# Patient Record
Sex: Female | Born: 1950 | Race: White | Hispanic: No | Smoking: Never smoker
Health system: Southern US, Community
[De-identification: ages and names within clinical notes are randomized; demographics above are authoritative.]

## PROBLEM LIST (undated history)

## (undated) DIAGNOSIS — K219 Gastro-esophageal reflux disease without esophagitis: Secondary | ICD-10-CM

## (undated) DIAGNOSIS — S62133A Displaced fracture of capitate [os magnum] bone, unspecified wrist, initial encounter for closed fracture: Secondary | ICD-10-CM

## (undated) DIAGNOSIS — G4730 Sleep apnea, unspecified: Secondary | ICD-10-CM

## (undated) DIAGNOSIS — E119 Type 2 diabetes mellitus without complications: Secondary | ICD-10-CM

## (undated) DIAGNOSIS — C801 Malignant (primary) neoplasm, unspecified: Secondary | ICD-10-CM

## (undated) DIAGNOSIS — I10 Essential (primary) hypertension: Secondary | ICD-10-CM

## (undated) DIAGNOSIS — S82891A Other fracture of right lower leg, initial encounter for closed fracture: Secondary | ICD-10-CM

## (undated) DIAGNOSIS — F4001 Agoraphobia with panic disorder: Secondary | ICD-10-CM

## (undated) DIAGNOSIS — F332 Major depressive disorder, recurrent severe without psychotic features: Secondary | ICD-10-CM

## (undated) DIAGNOSIS — M1990 Unspecified osteoarthritis, unspecified site: Secondary | ICD-10-CM

## (undated) DIAGNOSIS — D472 Monoclonal gammopathy: Secondary | ICD-10-CM

## (undated) HISTORY — PX: JOINT REPLACEMENT: SHX530

## (undated) HISTORY — DX: Essential (primary) hypertension: I10

## (undated) HISTORY — PX: KNEE ARTHROSCOPY: SUR90

## (undated) HISTORY — DX: Agoraphobia with panic disorder: F40.01

## (undated) HISTORY — DX: Sleep apnea, unspecified: G47.30

## (undated) HISTORY — DX: Major depressive disorder, recurrent severe without psychotic features: F33.2

## (undated) HISTORY — PX: TUBAL LIGATION: SHX77

## (undated) HISTORY — DX: Type 2 diabetes mellitus without complications: E11.9

## (undated) HISTORY — DX: Monoclonal gammopathy: D47.2

## (undated) HISTORY — DX: Unspecified osteoarthritis, unspecified site: M19.90

## (undated) HISTORY — DX: Gastro-esophageal reflux disease without esophagitis: K21.9

## (undated) HISTORY — PX: CHOLECYSTECTOMY: SHX55

---

## 2010-11-24 ENCOUNTER — Ambulatory Visit: Payer: Self-pay | Admitting: Family Medicine

## 2012-03-21 ENCOUNTER — Ambulatory Visit: Payer: Self-pay | Admitting: Family Medicine

## 2012-06-25 ENCOUNTER — Ambulatory Visit: Payer: Self-pay | Admitting: Family Medicine

## 2012-07-16 ENCOUNTER — Ambulatory Visit: Payer: Self-pay | Admitting: Family Medicine

## 2012-08-26 ENCOUNTER — Ambulatory Visit: Payer: Self-pay | Admitting: Family Medicine

## 2012-09-15 ENCOUNTER — Ambulatory Visit: Payer: Self-pay | Admitting: Family Medicine

## 2014-05-26 ENCOUNTER — Ambulatory Visit: Payer: Self-pay | Admitting: Primary Care

## 2015-02-18 DIAGNOSIS — I10 Essential (primary) hypertension: Secondary | ICD-10-CM | POA: Insufficient documentation

## 2015-02-18 DIAGNOSIS — K219 Gastro-esophageal reflux disease without esophagitis: Secondary | ICD-10-CM | POA: Insufficient documentation

## 2015-02-18 DIAGNOSIS — F332 Major depressive disorder, recurrent severe without psychotic features: Secondary | ICD-10-CM | POA: Insufficient documentation

## 2015-02-18 DIAGNOSIS — F4001 Agoraphobia with panic disorder: Secondary | ICD-10-CM | POA: Insufficient documentation

## 2015-02-18 DIAGNOSIS — G4730 Sleep apnea, unspecified: Secondary | ICD-10-CM | POA: Insufficient documentation

## 2015-02-18 DIAGNOSIS — E119 Type 2 diabetes mellitus without complications: Secondary | ICD-10-CM | POA: Insufficient documentation

## 2015-02-18 DIAGNOSIS — M1990 Unspecified osteoarthritis, unspecified site: Secondary | ICD-10-CM | POA: Insufficient documentation

## 2015-04-29 ENCOUNTER — Other Ambulatory Visit: Payer: Self-pay | Admitting: Primary Care

## 2015-04-29 DIAGNOSIS — Z1231 Encounter for screening mammogram for malignant neoplasm of breast: Secondary | ICD-10-CM

## 2015-05-28 ENCOUNTER — Ambulatory Visit
Admission: RE | Admit: 2015-05-28 | Discharge: 2015-05-28 | Disposition: A | Discharge status: Home / Self Care | Payer: Medicare Other | Source: Ambulatory Visit | Attending: Primary Care | Admitting: Primary Care

## 2015-05-28 DIAGNOSIS — Z1231 Encounter for screening mammogram for malignant neoplasm of breast: Secondary | ICD-10-CM | POA: Diagnosis not present

## 2015-05-28 HISTORY — DX: Malignant (primary) neoplasm, unspecified: C80.1

## 2015-06-03 ENCOUNTER — Other Ambulatory Visit: Payer: Self-pay | Admitting: Primary Care

## 2015-06-03 DIAGNOSIS — R928 Other abnormal and inconclusive findings on diagnostic imaging of breast: Secondary | ICD-10-CM

## 2015-06-03 DIAGNOSIS — N63 Unspecified lump in unspecified breast: Secondary | ICD-10-CM

## 2015-06-09 ENCOUNTER — Ambulatory Visit
Admission: RE | Admit: 2015-06-09 | Discharge: 2015-06-09 | Disposition: A | Discharge status: Home / Self Care | Payer: Medicare Other | Source: Ambulatory Visit | Attending: Primary Care | Admitting: Primary Care

## 2015-06-09 DIAGNOSIS — N63 Unspecified lump in unspecified breast: Secondary | ICD-10-CM

## 2015-06-09 DIAGNOSIS — R928 Other abnormal and inconclusive findings on diagnostic imaging of breast: Secondary | ICD-10-CM

## 2015-07-05 ENCOUNTER — Encounter: Admission: RE | Payer: Self-pay | Source: Ambulatory Visit

## 2015-07-05 ENCOUNTER — Ambulatory Visit
Admission: RE | Admit: 2015-07-05 | Payer: Medicare Other | Source: Ambulatory Visit | Admitting: Unknown Physician Specialty

## 2015-07-05 SURGERY — COLONOSCOPY
Anesthesia: General

## 2015-08-03 DIAGNOSIS — R5383 Other fatigue: Secondary | ICD-10-CM | POA: Insufficient documentation

## 2015-08-03 DIAGNOSIS — R0600 Dyspnea, unspecified: Secondary | ICD-10-CM | POA: Insufficient documentation

## 2015-12-09 ENCOUNTER — Other Ambulatory Visit: Payer: Self-pay | Admitting: Primary Care

## 2015-12-09 DIAGNOSIS — R928 Other abnormal and inconclusive findings on diagnostic imaging of breast: Secondary | ICD-10-CM

## 2015-12-29 ENCOUNTER — Ambulatory Visit
Admission: RE | Admit: 2015-12-29 | Discharge: 2015-12-29 | Disposition: A | Discharge status: Home / Self Care | Payer: Medicare Other | Source: Ambulatory Visit | Attending: Primary Care | Admitting: Primary Care

## 2015-12-29 ENCOUNTER — Other Ambulatory Visit: Payer: Self-pay | Admitting: Primary Care

## 2015-12-29 DIAGNOSIS — N63 Unspecified lump in breast: Secondary | ICD-10-CM | POA: Insufficient documentation

## 2015-12-29 DIAGNOSIS — R928 Other abnormal and inconclusive findings on diagnostic imaging of breast: Secondary | ICD-10-CM | POA: Diagnosis not present

## 2016-04-20 ENCOUNTER — Other Ambulatory Visit: Payer: Self-pay | Admitting: Primary Care

## 2016-04-24 ENCOUNTER — Other Ambulatory Visit: Payer: Self-pay | Admitting: Primary Care

## 2016-04-24 DIAGNOSIS — R928 Other abnormal and inconclusive findings on diagnostic imaging of breast: Secondary | ICD-10-CM

## 2016-06-12 ENCOUNTER — Other Ambulatory Visit: Payer: Medicare Other

## 2016-06-12 ENCOUNTER — Ambulatory Visit: Admission: RE | Admit: 2016-06-12 | Payer: Medicare Other | Source: Ambulatory Visit

## 2016-07-06 ENCOUNTER — Ambulatory Visit
Admission: RE | Admit: 2016-07-06 | Discharge: 2016-07-06 | Disposition: A | Discharge status: Home / Self Care | Payer: Medicare Other | Source: Ambulatory Visit | Attending: Primary Care | Admitting: Primary Care

## 2016-07-06 DIAGNOSIS — D241 Benign neoplasm of right breast: Secondary | ICD-10-CM | POA: Insufficient documentation

## 2016-07-06 DIAGNOSIS — R928 Other abnormal and inconclusive findings on diagnostic imaging of breast: Secondary | ICD-10-CM

## 2017-01-24 DIAGNOSIS — G5601 Carpal tunnel syndrome, right upper limb: Secondary | ICD-10-CM | POA: Insufficient documentation

## 2017-05-28 ENCOUNTER — Other Ambulatory Visit: Payer: Self-pay | Admitting: Primary Care

## 2017-05-28 DIAGNOSIS — Z1231 Encounter for screening mammogram for malignant neoplasm of breast: Secondary | ICD-10-CM

## 2017-08-03 ENCOUNTER — Ambulatory Visit
Admission: RE | Admit: 2017-08-03 | Discharge: 2017-08-03 | Disposition: A | Discharge status: Home / Self Care | Payer: Medicare Other | Source: Ambulatory Visit | Attending: Primary Care | Admitting: Primary Care

## 2017-08-03 DIAGNOSIS — Z1231 Encounter for screening mammogram for malignant neoplasm of breast: Secondary | ICD-10-CM

## 2017-08-29 ENCOUNTER — Other Ambulatory Visit: Payer: Self-pay | Admitting: Primary Care

## 2017-08-29 DIAGNOSIS — Z0000 Encounter for general adult medical examination without abnormal findings: Secondary | ICD-10-CM

## 2017-10-10 ENCOUNTER — Other Ambulatory Visit: Payer: Medicare Other

## 2018-03-20 ENCOUNTER — Emergency Department
Admission: EM | Admit: 2018-03-20 | Discharge: 2018-03-20 | Disposition: A | Discharge status: Home / Self Care | Payer: Medicare Other | Attending: Student in an Organized Health Care Education/Training Program | Admitting: Student in an Organized Health Care Education/Training Program

## 2018-03-20 ENCOUNTER — Emergency Department: Payer: Medicare Other

## 2018-03-20 ENCOUNTER — Encounter: Payer: Self-pay | Admitting: Emergency Medicine

## 2018-03-20 ENCOUNTER — Other Ambulatory Visit: Payer: Self-pay

## 2018-03-20 DIAGNOSIS — E119 Type 2 diabetes mellitus without complications: Secondary | ICD-10-CM | POA: Insufficient documentation

## 2018-03-20 DIAGNOSIS — Z79899 Other long term (current) drug therapy: Secondary | ICD-10-CM | POA: Insufficient documentation

## 2018-03-20 DIAGNOSIS — R55 Syncope and collapse: Secondary | ICD-10-CM

## 2018-03-20 DIAGNOSIS — I10 Essential (primary) hypertension: Secondary | ICD-10-CM | POA: Insufficient documentation

## 2018-03-20 LAB — CBC
HCT: 37.9 % (ref 35.0–47.0)
Hemoglobin: 12.9 g/dL (ref 12.0–16.0)
MCH: 30.6 pg (ref 26.0–34.0)
MCHC: 33.9 g/dL (ref 32.0–36.0)
MCV: 90.3 fL (ref 80.0–100.0)
Platelets: 316 10*3/uL (ref 150–440)
RBC: 4.2 MIL/uL (ref 3.80–5.20)
RDW: 13.6 % (ref 11.5–14.5)
WBC: 12.4 10*3/uL — ABNORMAL HIGH (ref 3.6–11.0)

## 2018-03-20 LAB — BASIC METABOLIC PANEL
ANION GAP: 15 (ref 5–15)
BUN: 23 mg/dL — ABNORMAL HIGH (ref 6–20)
CALCIUM: 9.6 mg/dL (ref 8.9–10.3)
CO2: 22 mmol/L (ref 22–32)
Chloride: 96 mmol/L — ABNORMAL LOW (ref 101–111)
Creatinine, Ser: 1.73 mg/dL — ABNORMAL HIGH (ref 0.44–1.00)
GFR calc Af Amer: 34 mL/min — ABNORMAL LOW (ref >60)
GFR, EST NON AFRICAN AMERICAN: 30 mL/min — AB (ref >60)
GLUCOSE: 152 mg/dL — AB (ref 65–99)
Potassium: 3.6 mmol/L (ref 3.5–5.1)
Sodium: 133 mmol/L — ABNORMAL LOW (ref 135–145)

## 2018-03-20 LAB — TROPONIN I: Troponin I: <0.03 ng/mL (ref <0.03)

## 2018-03-20 MED ORDER — SODIUM CHLORIDE 0.9 % IV BOLUS
500.0000 mL | Freq: Once | INTRAVENOUS | Status: AC
  Administered 2018-03-20: 500 mL via INTRAVENOUS

## 2018-03-20 NOTE — ED Notes (Signed)
Pt resting in bed with fluids running. Pt in NAd and continues to report that she feels back to her baseline. Pt watching TV at this time. Bed is locked and in lowest position with call bell in reach.

## 2018-03-20 NOTE — ED Provider Notes (Signed)
 -----------------------------------------   5:11 PM on 03/20/2018 -----------------------------------------  Assumed care from Dr. Quentin Cornwall with a plan to follow up on a second troponin, if negative patient is suitable for discharge home and outpatient follow-up with her primary care doctor.  Second troponin has resulted and is in fact negative. plan to discharge home in good condition. Patient well appearing at this time.   Carrie Mew, MD 03/20/18 [phone]

## 2018-03-20 NOTE — ED Provider Notes (Signed)
[facility] Emergency Department Provider Note    First MD Initiated Contact with Patient 03/20/18 1255     (approximate)  I have reviewed the triage vital signs and the nursing notes.   HISTORY  Chief Complaint Near Syncope    HPI Sue Hayes is a 67 y.o. female  Presents with sensation of nearly fainting.  States that she felt weak, pale, dizzy, nauseated while cleaning this AM.  Felt she needed to use the restroom but only had a few loose stools, then started to feel better.  Since then, has felt "not right."  Denies CP, SOB, N,V.  Does feel that her arms of heavy and weak.  Does take iron for anemia.  CBG was 154.   Denies any melena or hematochezia.  No seizure like activity. She does endorse    Past Medical History:  Diagnosis Date  . Acid reflux disease   . Arthritis   . Cancer (HCC)    cervical cancer  . HTN (hypertension)   . Panic disorder with agoraphobia and moderate panic attacks   . Recurrent major depression-severe ([place])   . Sleep apnea   . Type II diabetes mellitus (HCC)    Family History  Problem Relation Age of Onset  . Depression Mother   . Bipolar disorder Sister   . Depression Sister   . Breast cancer Cousin    History reviewed. No pertinent surgical history. Patient Active Problem List   Diagnosis Date Noted  . Acid reflux 02/18/2015  . Arthritis 02/18/2015  . BP (high blood pressure) 02/18/2015  . Panic disorder with agoraphobia and moderate panic attacks 02/18/2015  . Depression, major, severe recurrence ([place]) 02/18/2015  . Apnea, sleep 02/18/2015  . Diabetes mellitus, type 2 ([place]) 02/18/2015      Prior to Admission medications   Medication Sig Start Date End Date Taking? Authorizing Provider  atenolol (TENORMIN) 25 MG tablet Take 1 tablet by mouth daily.    [provider]  atorvastatin (LIPITOR) 80 MG tablet Take 1 tablet by mouth daily.    [provider]  DULoxetine (CYMBALTA) 60 MG  capsule Take 1 capsule by mouth 2 (two) times daily.    [provider]  enalapril (VASOTEC) 10 MG tablet Take 1 tablet by mouth daily.    [provider]  esomeprazole (NEXIUM) 40 MG capsule Take 1 capsule by mouth daily.    [provider]  hydrochlorothiazide (HYDRODIURIL) 25 MG tablet Take 1 tablet by mouth daily.    [provider]  metFORMIN (GLUCOPHAGE) 500 MG tablet Take 1 tablet by mouth 2 (two) times daily.    [provider]  naproxen (NAPROSYN) 500 MG tablet Take 1 tablet by mouth daily.    [provider]  traZODone (DESYREL) 100 MG tablet Take 1 tablet by mouth at bedtime.    [provider]    Allergies Penicillins and Zolpidem    Social History Social History   Tobacco Use  . Smoking status: Never Smoker  . Smokeless tobacco: Never Used  Substance Use Topics  . Alcohol use: Yes  . Drug use: Never    Review of Systems Patient denies headaches, rhinorrhea, blurry vision, numbness, shortness of breath, chest pain, edema, cough, abdominal pain, nausea, vomiting, diarrhea, dysuria, fevers, rashes or hallucinations unless otherwise stated above in HPI. ____________________________________________   PHYSICAL EXAM:  VITAL SIGNS: Vitals:   03/20/18 1248 03/20/18 1259  BP: (!) 146/72 (!) 148/74  Pulse: 72 72  Resp: (!) 22 (!) 22  Temp:  97.6 F (36.4 C)  SpO2: 100% 100%    Constitutional: Alert and oriented.  Eyes: Conjunctivae are normal.  Head: Atraumatic. Nose: No congestion/rhinnorhea. Mouth/Throat: Mucous membranes are moist.   Neck: No stridor. Painless ROM.  Cardiovascular: Normal rate, regular rhythm. Grossly normal heart sounds.  Good peripheral circulation. Respiratory: Normal respiratory effort.  No retractions. Lungs CTAB. Gastrointestinal: Soft and nontender. No distention. No abdominal bruits. No CVA tenderness. Genitourinary:  Musculoskeletal: No lower extremity tenderness nor  edema.  No joint effusions. Neurologic:  Normal speech and language. No gross focal neurologic deficits are appreciated. No facial droop Skin:  Skin is warm, dry and intact. No rash noted. Psychiatric: Mood and affect are normal. Speech and behavior are normal.  ____________________________________________   LABS (all labs ordered are listed, but only abnormal results are displayed)  Results for orders placed or performed during the hospital encounter of 03/20/18 (from the past 24 hour(s))  Basic metabolic panel     Status: Abnormal   Collection Time: 03/20/18 12:51 PM  Result Value Ref Range   Sodium 133 (L) 135 - 145 mmol/L   Potassium 3.6 3.5 - 5.1 mmol/L   Chloride 96 (L) 101 - 111 mmol/L   CO2 22 22 - 32 mmol/L   Glucose, Bld 152 (H) 65 - 99 mg/dL   BUN 23 (H) 6 - 20 mg/dL   Creatinine, Ser 1.73 (H) 0.44 - 1.00 mg/dL   Calcium 9.6 8.9 - 10.3 mg/dL   GFR calc non Af Amer 30 (L) >60 mL/min   GFR calc Af Amer 34 (L) >60 mL/min   Anion gap 15 5 - 15  CBC     Status: Abnormal   Collection Time: 03/20/18 12:51 PM  Result Value Ref Range   WBC 12.4 (H) 3.6 - 11.0 K/uL   RBC 4.20 3.80 - 5.20 MIL/uL   Hemoglobin 12.9 12.0 - 16.0 g/dL   HCT 37.9 35.0 - 47.0 %   MCV 90.3 80.0 - 100.0 fL   MCH 30.6 26.0 - 34.0 pg   MCHC 33.9 32.0 - 36.0 g/dL   RDW 13.6 11.5 - 14.5 %   Platelets 316 150 - 440 K/uL  Troponin I     Status: None   Collection Time: 03/20/18 12:51 PM  Result Value Ref Range   Troponin I <0.03 <0.03 ng/mL   ____________________________________________  EKG My review and personal interpretation at Time: 12:43   Indication: fainting spell  Rate: 65  Rhythm: sinus Axis: normal Other: normal intervals, no stemi, nonspecific st abn ____________________________________________  RADIOLOGY  I personally reviewed all radiographic images ordered to evaluate for the above acute complaints and reviewed radiology reports and findings.  These findings were personally  discussed with the patient.  Please see medical record for radiology report.  ____________________________________________   PROCEDURES  Procedure(s) performed:  Procedures    Critical Care performed: no ____________________________________________   INITIAL IMPRESSION / ASSESSMENT AND PLAN / ED COURSE  Pertinent labs & imaging results that were available during my care of the patient were reviewed by me and considered in my medical decision making (see chart for details).   DDX: dehydration, electrolyte abn, anemia, dysrhythmia, ABLA, seizure, vasovagal  Sue Hayes is a 67 y.o. who presents to the ED with symptoms as described above.  Patient well-appearing and in no acute distress at this time.  Fairly atypical presentation based on her age will further risk stratify with serial troponins.  Her initial EKG shows no evidence of dysrhythmia or ischemic changes.  Troponin shows no abnormality.  Borderline CKD.  We will give IV fluids for component of dehydration.  Not clinically consistent with CVA or seizure activity.  Not clinically consistent with dissection or PE.  Based on her presentation have a high suspicion for vasovagal near syncope as she was having diarrheal illness which would also explain part of her leukocytosis.  Patient will be signed out to oncoming physician pending repeat troponin.      As part of my medical decision making, I reviewed the following data within the [place] notes reviewed and incorporated, Labs reviewed, notes from prior ED visits.   ____________________________________________   FINAL CLINICAL IMPRESSION(S) / ED DIAGNOSES  Final diagnoses:  Near syncope      NEW MEDICATIONS STARTED DURING THIS VISIT:  New Prescriptions   No medications on file     Note:  This document was prepared using Dragon voice recognition software and may include unintentional dictation errors.    Merlyn Lot,  MD 03/20/18 [phone]

## 2018-03-20 NOTE — ED Triage Notes (Addendum)
Pt in via POV with complaints of near syncopal episode today, becoming very pale, diaphoretic, weak.  Pt reports low BP but unable to recall readings.  Pt lethargic w/ increased work of breathing, vitals WDL.

## 2018-03-20 NOTE — Discharge Instructions (Addendum)
Your tests today were unremarkable and do not show any signs of heart attack. Follow up with your doctor for continued monitoring of your symptoms.

## 2018-06-25 ENCOUNTER — Other Ambulatory Visit: Payer: Self-pay | Admitting: Primary Care

## 2018-06-25 DIAGNOSIS — Z1231 Encounter for screening mammogram for malignant neoplasm of breast: Secondary | ICD-10-CM

## 2018-08-05 ENCOUNTER — Ambulatory Visit
Admission: RE | Admit: 2018-08-05 | Discharge: 2018-08-05 | Disposition: A | Discharge status: Home / Self Care | Payer: Medicare Other | Source: Ambulatory Visit | Attending: Primary Care | Admitting: Primary Care

## 2018-08-05 DIAGNOSIS — Z1231 Encounter for screening mammogram for malignant neoplasm of breast: Secondary | ICD-10-CM

## 2018-12-16 ENCOUNTER — Other Ambulatory Visit: Payer: Self-pay | Admitting: Family Medicine

## 2018-12-16 DIAGNOSIS — Z780 Asymptomatic menopausal state: Secondary | ICD-10-CM

## 2019-02-23 DIAGNOSIS — M1711 Unilateral primary osteoarthritis, right knee: Secondary | ICD-10-CM | POA: Insufficient documentation

## 2019-05-31 NOTE — Discharge Instructions (Signed)
°  Instructions after Total Knee Replacement ° ° Sue Hayes, Jr., M.D.    ° Dept. of Orthopaedics & Sports Medicine ° [facility] ° [address] ° [place], [place]  [address] ° Phone: [phone]   Fax: [phone] ° °  °DIET: °• Drink plenty of non-alcoholic fluids. °• Resume your normal diet. Include foods high in fiber. ° °ACTIVITY:  °• You may use crutches or a walker with weight-bearing as tolerated, unless instructed otherwise. °• You may be weaned off of the walker or crutches by your Physical Therapist.  °• Do NOT place pillows under the knee. Anything placed under the knee could limit your ability to straighten the knee.   °• Continue doing gentle exercises. Exercising will reduce the pain and swelling, increase motion, and prevent muscle weakness.   °• Please continue to use the TED compression stockings for 6 weeks. You may remove the stockings at night, but should reapply them in the morning. °• Do not drive or operate any equipment until instructed. ° °WOUND CARE:  °• Continue to use the PolarCare or ice packs periodically to reduce pain and swelling. °• You may bathe or shower after the staples are removed at the first office visit following surgery. ° °MEDICATIONS: °• You may resume your regular medications. °• Please take the pain medication as prescribed on the medication. °• Do not take pain medication on an empty stomach. °• You have been given a prescription for a blood thinner (Lovenox or Coumadin). Please take the medication as instructed. (NOTE: After completing a 2 week course of Lovenox, take one Enteric-coated aspirin once a day. This along with elevation will help reduce the possibility of phlebitis in your operated leg.) °• Do not drive or drink alcoholic beverages when taking pain medications. ° °CALL THE OFFICE FOR: °• Temperature above 101 degrees °• Excessive bleeding or drainage on the dressing. °• Excessive swelling, coldness, or paleness of the toes. °• Persistent  nausea and vomiting. ° °FOLLOW-UP:  °• You should have an appointment to return to the office in 10-14 days after surgery. °• Arrangements have been made for continuation of Physical Therapy (either home therapy or outpatient therapy). °  °

## 2019-06-05 ENCOUNTER — Other Ambulatory Visit
Admission: RE | Admit: 2019-06-05 | Discharge: 2019-06-05 | Disposition: A | Discharge status: Home / Self Care | Payer: Medicare Other | Source: Ambulatory Visit | Attending: Orthopedic Surgery | Admitting: Orthopedic Surgery

## 2019-06-05 ENCOUNTER — Encounter
Admission: RE | Admit: 2019-06-05 | Discharge: 2019-06-05 | Disposition: A | Discharge status: Home / Self Care | Payer: Medicare Other | Source: Ambulatory Visit | Attending: Orthopedic Surgery | Admitting: Orthopedic Surgery

## 2019-06-05 ENCOUNTER — Other Ambulatory Visit: Payer: Self-pay

## 2019-06-05 DIAGNOSIS — Z01818 Encounter for other preprocedural examination: Secondary | ICD-10-CM | POA: Diagnosis not present

## 2019-06-05 DIAGNOSIS — Z20828 Contact with and (suspected) exposure to other viral communicable diseases: Secondary | ICD-10-CM | POA: Diagnosis not present

## 2019-06-05 HISTORY — DX: Displaced fracture of capitate (os magnum) bone, unspecified wrist, initial encounter for closed fracture: S62.133A

## 2019-06-05 HISTORY — DX: Other fracture of right lower leg, initial encounter for closed fracture: S82.891A

## 2019-06-05 LAB — URINALYSIS, ROUTINE W REFLEX MICROSCOPIC
Bilirubin Urine: NEGATIVE
Glucose, UA: NEGATIVE mg/dL
Hgb urine dipstick: NEGATIVE
Ketones, ur: NEGATIVE mg/dL
Leukocytes,Ua: NEGATIVE
Nitrite: NEGATIVE
Protein, ur: NEGATIVE mg/dL
Specific Gravity, Urine: 1.008 (ref 1.005–1.030)
pH: 7 (ref 5.0–8.0)

## 2019-06-05 LAB — COMPREHENSIVE METABOLIC PANEL
ALT: 30 U/L (ref 0–44)
AST: 26 U/L (ref 15–41)
Albumin: 4.4 g/dL (ref 3.5–5.0)
Alkaline Phosphatase: 59 U/L (ref 38–126)
Anion gap: 10 (ref 5–15)
BUN: 16 mg/dL (ref 8–23)
CO2: 28 mmol/L (ref 22–32)
Calcium: 10 mg/dL (ref 8.9–10.3)
Chloride: 96 mmol/L — ABNORMAL LOW (ref 98–111)
Creatinine, Ser: 1.2 mg/dL — ABNORMAL HIGH (ref 0.44–1.00)
GFR calc Af Amer: 54 mL/min — ABNORMAL LOW (ref >60)
GFR calc non Af Amer: 47 mL/min — ABNORMAL LOW (ref >60)
Glucose, Bld: 111 mg/dL — ABNORMAL HIGH (ref 70–99)
Potassium: 3.3 mmol/L — ABNORMAL LOW (ref 3.5–5.1)
Sodium: 134 mmol/L — ABNORMAL LOW (ref 135–145)
Total Bilirubin: 0.8 mg/dL (ref 0.3–1.2)
Total Protein: 8.3 g/dL — ABNORMAL HIGH (ref 6.5–8.1)

## 2019-06-05 LAB — PROTIME-INR
INR: 0.9 (ref 0.8–1.2)
Prothrombin Time: 11.9 seconds (ref 11.4–15.2)

## 2019-06-05 LAB — CBC
HCT: 37.6 % (ref 36.0–46.0)
Hemoglobin: 12.1 g/dL (ref 12.0–15.0)
MCH: 28.3 pg (ref 26.0–34.0)
MCHC: 32.2 g/dL (ref 30.0–36.0)
MCV: 88.1 fL (ref 80.0–100.0)
Platelets: 351 10*3/uL (ref 150–400)
RBC: 4.27 MIL/uL (ref 3.87–5.11)
RDW: 13.5 % (ref 11.5–15.5)
WBC: 8.3 10*3/uL (ref 4.0–10.5)
nRBC: 0 % (ref 0.0–0.2)

## 2019-06-05 LAB — APTT: aPTT: 32 seconds (ref 24–36)

## 2019-06-05 LAB — HEMOGLOBIN A1C
Hgb A1c MFr Bld: 6.9 % — ABNORMAL HIGH (ref 4.8–5.6)
Mean Plasma Glucose: 151.33 mg/dL

## 2019-06-05 LAB — TYPE AND SCREEN
ABO/RH(D): A POS
Antibody Screen: NEGATIVE

## 2019-06-05 LAB — SURGICAL PCR SCREEN
MRSA, PCR: NEGATIVE
Staphylococcus aureus: NEGATIVE

## 2019-06-05 LAB — C-REACTIVE PROTEIN: CRP: <0.8 mg/dL (ref <1.0)

## 2019-06-05 LAB — SEDIMENTATION RATE: Sed Rate: 48 mm/hr — ABNORMAL HIGH (ref 0–30)

## 2019-06-05 LAB — SARS CORONAVIRUS 2 (TAT 6-24 HRS): SARS Coronavirus 2: NEGATIVE

## 2019-06-05 MED ORDER — CELECOXIB 200 MG PO CAPS
400.0000 mg | ORAL_CAPSULE | Freq: Once | ORAL | Status: DC
  Filled 2019-06-05: qty 2

## 2019-06-05 NOTE — Patient Instructions (Signed)
Your procedure is scheduled on: Wed. 8/26 Report to Day Surgery. To find out your arrival time please call [phone] between 1PM - 3PM on Tues 8/25.  Remember: Instructions that are not followed completely may result in serious medical risk,  up to and including death, or upon the discretion of your surgeon and anesthesiologist your  surgery may need to be rescheduled.     _X__ 1. Do not eat food after midnight the night before your procedure.                 No gum chewing or hard candies. You may drink clear liquids up to 2 hours                 before you are scheduled to arrive for your surgery- DO not drink clear                 liquids within 2 hours of the start of your surgery.                 Clear Liquids include:  water,Gatorade, Black Coffee or Tea (Do not add                 anything to coffee or tea).  __X__2.  On the morning of surgery brush your teeth with toothpaste and water, you                may rinse your mouth with mouthwash if you wish.  Do not swallow any toothpaste of mouthwash.     ___ 3.  No Alcohol for 24 hours before or after surgery.   ___ 4.  Do Not Smoke or use e-cigarettes For 24 Hours Prior to Your Surgery.                 Do not use any chewable tobacco products for at least 6 hours prior to                 surgery.  ____  5.  Bring all medications with you on the day of surgery if instructed.   __x__  6.  Notify your doctor if there is any change in your medical condition      (cold, fever, infections).     Do not wear jewelry, make-up, hairpins, clips or nail polish. Do not wear lotions, powders, or perfumes. You may wear deodorant. Do not shave 48 hours prior to surgery. Men may shave face and neck. Do not bring valuables to the hospital.    [facility] is not responsible for any belongings or valuables.  Contacts, dentures or bridgework may not be worn into surgery. Leave your suitcase in the car. After surgery  it may be brought to your room. For patients admitted to the hospital, discharge time is determined by your treatment team.   Patients discharged the day of surgery will not be allowed to drive home.   Please read over the following fact sheets that you were given:    _x___ Take these medicines the morning of surgery with A SIP OF WATER:    1. atenolol (TENORMIN) 25 MG tablet  2. busPIRone (BUSPAR) 5 MG tablet  3. esomeprazole (NEXIUM) 40 MG capsule  4.DULoxetine (CYMBALTA) 60 MG capsule  5.  6.  ____ Fleet Enema (as directed)   ____ Use CHG Soap as directed  ____ Use inhalers on the day of surgery  __x__ Stop metformin 2 days prior to surgery  Sunday  last dose    ____ Take 1/2 of usual insulin dose the night before surgery. No insulin the morning          of surgery.   ____ Stop Coumadin/Plavix/aspirin on   __x__ Stop Anti-inflammatories ibuprofen (ADVIL) 200 MG tablet or Aleve or aspirin     May take tylenol   ____ Stop supplements until after surgery.    ____ Bring C-Pap to the hospital. Doesn't use Bipap regularly

## 2019-06-05 NOTE — Pre-Procedure Instructions (Signed)
LABS FAXED TO DR HOOTEN 

## 2019-06-06 ENCOUNTER — Other Ambulatory Visit: Admission: RE | Admit: 2019-06-06 | Payer: Medicare Other | Source: Ambulatory Visit

## 2019-06-06 LAB — URINE CULTURE
Culture: NO GROWTH
Special Requests: NORMAL

## 2019-06-06 NOTE — Pre-Procedure Instructions (Signed)
HgbA1C results sent to Dr. Hooten for review. 

## 2019-06-08 LAB — IGE: IgE (Immunoglobulin E), Serum: 165 IU/mL (ref 6–495)

## 2019-06-10 MED ORDER — TRANEXAMIC ACID-NACL 1000-0.7 MG/100ML-% IV SOLN
1000.0000 mg | INTRAVENOUS | Status: DC
  Filled 2019-06-10: qty 100

## 2019-06-10 MED ORDER — CLINDAMYCIN PHOSPHATE 900 MG/50ML IV SOLN: 900.0000 mg | INTRAVENOUS | Status: DC

## 2019-06-11 ENCOUNTER — Inpatient Hospital Stay: Payer: Medicare Other | Admitting: Anesthesiology

## 2019-06-11 ENCOUNTER — Encounter: Payer: Self-pay | Admitting: Orthopedic Surgery

## 2019-06-11 ENCOUNTER — Encounter
Admission: RE | Disposition: A | Discharge status: Home Health | Payer: Self-pay | Source: Home / Self Care | Attending: Orthopedic Surgery

## 2019-06-11 ENCOUNTER — Other Ambulatory Visit: Payer: Self-pay

## 2019-06-11 ENCOUNTER — Inpatient Hospital Stay
Admission: RE | Admit: 2019-06-11 | Discharge: 2019-06-13 | DRG: 470 | Disposition: A | Discharge status: Home Health | Payer: Medicare Other | Attending: Orthopedic Surgery | Admitting: Orthopedic Surgery

## 2019-06-11 ENCOUNTER — Inpatient Hospital Stay: Payer: Medicare Other

## 2019-06-11 DIAGNOSIS — Z79899 Other long term (current) drug therapy: Secondary | ICD-10-CM

## 2019-06-11 DIAGNOSIS — E785 Hyperlipidemia, unspecified: Secondary | ICD-10-CM | POA: Insufficient documentation

## 2019-06-11 DIAGNOSIS — Z7984 Long term (current) use of oral hypoglycemic drugs: Secondary | ICD-10-CM

## 2019-06-11 DIAGNOSIS — M1711 Unilateral primary osteoarthritis, right knee: Secondary | ICD-10-CM | POA: Diagnosis present

## 2019-06-11 DIAGNOSIS — K219 Gastro-esophageal reflux disease without esophagitis: Secondary | ICD-10-CM | POA: Diagnosis present

## 2019-06-11 DIAGNOSIS — F418 Other specified anxiety disorders: Secondary | ICD-10-CM | POA: Insufficient documentation

## 2019-06-11 DIAGNOSIS — F4001 Agoraphobia with panic disorder: Secondary | ICD-10-CM | POA: Diagnosis present

## 2019-06-11 DIAGNOSIS — Z8541 Personal history of malignant neoplasm of cervix uteri: Secondary | ICD-10-CM

## 2019-06-11 DIAGNOSIS — G4700 Insomnia, unspecified: Secondary | ICD-10-CM | POA: Insufficient documentation

## 2019-06-11 DIAGNOSIS — E119 Type 2 diabetes mellitus without complications: Secondary | ICD-10-CM | POA: Diagnosis present

## 2019-06-11 DIAGNOSIS — I10 Essential (primary) hypertension: Secondary | ICD-10-CM | POA: Diagnosis present

## 2019-06-11 DIAGNOSIS — K5900 Constipation, unspecified: Secondary | ICD-10-CM | POA: Insufficient documentation

## 2019-06-11 DIAGNOSIS — Z96659 Presence of unspecified artificial knee joint: Secondary | ICD-10-CM

## 2019-06-11 HISTORY — PX: KNEE ARTHROPLASTY: SHX992

## 2019-06-11 LAB — GLUCOSE, CAPILLARY
Glucose-Capillary: 132 mg/dL — ABNORMAL HIGH (ref 70–99)
Glucose-Capillary: 213 mg/dL — ABNORMAL HIGH (ref 70–99)
Glucose-Capillary: 318 mg/dL — ABNORMAL HIGH (ref 70–99)
Glucose-Capillary: 353 mg/dL — ABNORMAL HIGH (ref 70–99)

## 2019-06-11 LAB — POCT I-STAT 4, (NA,K, GLUC, HGB,HCT)
Glucose, Bld: 155 mg/dL — ABNORMAL HIGH (ref 70–99)
HCT: 42 % (ref 36.0–46.0)
Hemoglobin: 14.3 g/dL (ref 12.0–15.0)
Potassium: 4 mmol/L (ref 3.5–5.1)
Sodium: 137 mmol/L (ref 135–145)

## 2019-06-11 LAB — ABO/RH: ABO/RH(D): A POS

## 2019-06-11 SURGERY — ARTHROPLASTY, KNEE, TOTAL, USING IMAGELESS COMPUTER-ASSISTED NAVIGATION
Anesthesia: Spinal | Site: Knee | Laterality: Right

## 2019-06-11 MED ORDER — ALUM & MAG HYDROXIDE-SIMETH 200-200-20 MG/5ML PO SUSP: 30.0000 mL | ORAL | Status: DC | PRN

## 2019-06-11 MED ORDER — MIDAZOLAM HCL 5 MG/5ML IJ SOLN
INTRAMUSCULAR | Status: DC | PRN
  Administered 2019-06-11: 2 mg via INTRAVENOUS

## 2019-06-11 MED ORDER — DULOXETINE HCL 60 MG PO CPEP
60.0000 mg | ORAL_CAPSULE | Freq: Two times a day (BID) | ORAL | Status: DC
  Administered 2019-06-11 – 2019-06-13 (×4): 60 mg via ORAL
  Filled 2019-06-11 (×5): qty 1

## 2019-06-11 MED ORDER — SODIUM CHLORIDE 0.9 % IV SOLN
INTRAVENOUS | Status: DC | PRN
  Administered 2019-06-11: 15 ug/min via INTRAVENOUS

## 2019-06-11 MED ORDER — GABAPENTIN 300 MG PO CAPS
300.0000 mg | ORAL_CAPSULE | Freq: Once | ORAL | Status: AC
  Administered 2019-06-11: 10:00:00 300 mg via ORAL

## 2019-06-11 MED ORDER — METOCLOPRAMIDE HCL 5 MG/ML IJ SOLN: 5.0000 mg | Freq: Three times a day (TID) | INTRAMUSCULAR | Status: DC | PRN

## 2019-06-11 MED ORDER — PANTOPRAZOLE SODIUM 40 MG PO TBEC
40.0000 mg | DELAYED_RELEASE_TABLET | Freq: Two times a day (BID) | ORAL | Status: DC
  Administered 2019-06-11 – 2019-06-13 (×4): 40 mg via ORAL
  Filled 2019-06-11 (×4): qty 1

## 2019-06-11 MED ORDER — PROPOFOL 10 MG/ML IV BOLUS
INTRAVENOUS | Status: AC
  Filled 2019-06-11: qty 20

## 2019-06-11 MED ORDER — CLINDAMYCIN PHOSPHATE 900 MG/50ML IV SOLN
INTRAVENOUS | Status: DC | PRN
  Administered 2019-06-11: 900 mg via INTRAVENOUS

## 2019-06-11 MED ORDER — ATENOLOL 25 MG PO TABS
25.0000 mg | ORAL_TABLET | Freq: Every day | ORAL | Status: DC
  Administered 2019-06-12 – 2019-06-13 (×2): 25 mg via ORAL
  Filled 2019-06-11 (×2): qty 1

## 2019-06-11 MED ORDER — MIDAZOLAM HCL 2 MG/2ML IJ SOLN
INTRAMUSCULAR | Status: AC
  Filled 2019-06-11: qty 2

## 2019-06-11 MED ORDER — BUSPIRONE HCL 10 MG PO TABS
5.0000 mg | ORAL_TABLET | Freq: Two times a day (BID) | ORAL | Status: DC
  Administered 2019-06-11 – 2019-06-13 (×4): 5 mg via ORAL
  Filled 2019-06-11 (×4): qty 1

## 2019-06-11 MED ORDER — ENOXAPARIN SODIUM 30 MG/0.3ML ~~LOC~~ SOLN
30.0000 mg | Freq: Two times a day (BID) | SUBCUTANEOUS | Status: DC
  Administered 2019-06-12 – 2019-06-13 (×3): 30 mg via SUBCUTANEOUS
  Filled 2019-06-11 (×3): qty 0.3

## 2019-06-11 MED ORDER — SENNOSIDES-DOCUSATE SODIUM 8.6-50 MG PO TABS
1.0000 | ORAL_TABLET | Freq: Two times a day (BID) | ORAL | Status: DC
  Administered 2019-06-11 – 2019-06-13 (×4): 1 via ORAL
  Filled 2019-06-11 (×4): qty 1

## 2019-06-11 MED ORDER — METOCLOPRAMIDE HCL 10 MG PO TABS
10.0000 mg | ORAL_TABLET | Freq: Three times a day (TID) | ORAL | Status: DC
  Administered 2019-06-11 – 2019-06-13 (×7): 10 mg via ORAL
  Filled 2019-06-11 (×7): qty 1

## 2019-06-11 MED ORDER — BISACODYL 10 MG RE SUPP
10.0000 mg | Freq: Every day | RECTAL | Status: DC | PRN
  Administered 2019-06-13: 08:00:00 10 mg via RECTAL
  Filled 2019-06-11: qty 1

## 2019-06-11 MED ORDER — GABAPENTIN 300 MG PO CAPS
300.0000 mg | ORAL_CAPSULE | Freq: Every day | ORAL | Status: DC
  Administered 2019-06-11 – 2019-06-12 (×2): 300 mg via ORAL
  Filled 2019-06-11 (×2): qty 1

## 2019-06-11 MED ORDER — CLINDAMYCIN PHOSPHATE 900 MG/50ML IV SOLN
INTRAVENOUS | Status: AC
  Filled 2019-06-11: qty 50

## 2019-06-11 MED ORDER — GABAPENTIN 300 MG PO CAPS
ORAL_CAPSULE | ORAL | Status: AC
  Administered 2019-06-11: 300 mg via ORAL
  Filled 2019-06-11: qty 1

## 2019-06-11 MED ORDER — FENTANYL CITRATE (PF) 100 MCG/2ML IJ SOLN: 25.0000 ug | INTRAMUSCULAR | Status: DC | PRN

## 2019-06-11 MED ORDER — ONDANSETRON HCL 4 MG PO TABS: 4.0000 mg | ORAL_TABLET | Freq: Four times a day (QID) | ORAL | Status: DC | PRN

## 2019-06-11 MED ORDER — PHENYLEPHRINE HCL (PRESSORS) 10 MG/ML IV SOLN
INTRAVENOUS | Status: DC | PRN
  Administered 2019-06-11: 50 ug via INTRAVENOUS
  Administered 2019-06-11 (×2): 100 ug via INTRAVENOUS

## 2019-06-11 MED ORDER — SODIUM CHLORIDE 0.9 % IV SOLN
INTRAVENOUS | Status: DC
  Administered 2019-06-11: 18:00:00 via INTRAVENOUS

## 2019-06-11 MED ORDER — ONDANSETRON HCL 4 MG/2ML IJ SOLN: 4.0000 mg | Freq: Once | INTRAMUSCULAR | Status: DC | PRN

## 2019-06-11 MED ORDER — DEXAMETHASONE SODIUM PHOSPHATE 10 MG/ML IJ SOLN
8.0000 mg | Freq: Once | INTRAMUSCULAR | Status: AC
  Administered 2019-06-11: 10:00:00 8 mg via INTRAVENOUS

## 2019-06-11 MED ORDER — PROPOFOL 500 MG/50ML IV EMUL
INTRAVENOUS | Status: DC | PRN
  Administered 2019-06-11: 65 ug/kg/min via INTRAVENOUS

## 2019-06-11 MED ORDER — PROPOFOL 10 MG/ML IV BOLUS
INTRAVENOUS | Status: DC | PRN
  Administered 2019-06-11: 40 mg via INTRAVENOUS

## 2019-06-11 MED ORDER — TRANEXAMIC ACID-NACL 1000-0.7 MG/100ML-% IV SOLN
INTRAVENOUS | Status: DC | PRN
  Administered 2019-06-11: 1000 mg via INTRAVENOUS

## 2019-06-11 MED ORDER — SODIUM CHLORIDE 0.9 % IV SOLN
INTRAVENOUS | Status: DC | PRN
  Administered 2019-06-11: 60 mL

## 2019-06-11 MED ORDER — FLEET ENEMA 7-19 GM/118ML RE ENEM
1.0000 | ENEMA | Freq: Once | RECTAL | Status: AC | PRN
  Administered 2019-06-13: 13:00:00 1 via RECTAL

## 2019-06-11 MED ORDER — DEXAMETHASONE SODIUM PHOSPHATE 10 MG/ML IJ SOLN
INTRAMUSCULAR | Status: AC
  Administered 2019-06-11: 8 mg via INTRAVENOUS
  Filled 2019-06-11: qty 1

## 2019-06-11 MED ORDER — FERROUS GLUCONATE 324 (38 FE) MG PO TABS
324.0000 mg | ORAL_TABLET | Freq: Every day | ORAL | Status: DC
  Administered 2019-06-11 – 2019-06-12 (×2): 324 mg via ORAL
  Filled 2019-06-11 (×3): qty 1

## 2019-06-11 MED ORDER — CELECOXIB 200 MG PO CAPS
400.0000 mg | ORAL_CAPSULE | Freq: Once | ORAL | Status: AC
  Administered 2019-06-11: 10:00:00 400 mg via ORAL

## 2019-06-11 MED ORDER — CHLORHEXIDINE GLUCONATE 4 % EX LIQD
60.0000 mL | Freq: Once | CUTANEOUS | Status: AC
  Administered 2019-06-11: 4 via TOPICAL

## 2019-06-11 MED ORDER — METFORMIN HCL 500 MG PO TABS
500.0000 mg | ORAL_TABLET | Freq: Two times a day (BID) | ORAL | Status: DC
  Administered 2019-06-11 – 2019-06-13 (×4): 500 mg via ORAL
  Filled 2019-06-11 (×4): qty 1

## 2019-06-11 MED ORDER — TRAMADOL HCL 50 MG PO TABS
50.0000 mg | ORAL_TABLET | ORAL | Status: DC | PRN
  Administered 2019-06-12 – 2019-06-13 (×3): 100 mg via ORAL
  Filled 2019-06-11 (×3): qty 2

## 2019-06-11 MED ORDER — INSULIN ASPART 100 UNIT/ML ~~LOC~~ SOLN
0.0000 [IU] | Freq: Three times a day (TID) | SUBCUTANEOUS | Status: DC
  Administered 2019-06-12 (×2): 5 [IU] via SUBCUTANEOUS
  Administered 2019-06-13: 3 [IU] via SUBCUTANEOUS
  Administered 2019-06-13: 12:00:00 2 [IU] via SUBCUTANEOUS
  Filled 2019-06-11 (×5): qty 1

## 2019-06-11 MED ORDER — ENSURE PRE-SURGERY PO LIQD
296.0000 mL | Freq: Once | ORAL | Status: AC
  Administered 2019-06-11: 296 mL via ORAL
  Filled 2019-06-11: qty 296

## 2019-06-11 MED ORDER — ACETAMINOPHEN 10 MG/ML IV SOLN
1000.0000 mg | Freq: Four times a day (QID) | INTRAVENOUS | Status: AC
  Administered 2019-06-11 – 2019-06-12 (×4): 1000 mg via INTRAVENOUS
  Filled 2019-06-11 (×4): qty 100

## 2019-06-11 MED ORDER — CELECOXIB 200 MG PO CAPS
200.0000 mg | ORAL_CAPSULE | Freq: Two times a day (BID) | ORAL | Status: DC
  Administered 2019-06-11 – 2019-06-13 (×4): 200 mg via ORAL
  Filled 2019-06-11 (×4): qty 1

## 2019-06-11 MED ORDER — OXYCODONE HCL 5 MG PO TABS
5.0000 mg | ORAL_TABLET | ORAL | Status: DC | PRN
  Administered 2019-06-12 – 2019-06-13 (×3): 5 mg via ORAL
  Filled 2019-06-11 (×3): qty 1

## 2019-06-11 MED ORDER — ENALAPRIL MALEATE 10 MG PO TABS
10.0000 mg | ORAL_TABLET | Freq: Every day | ORAL | Status: DC
  Filled 2019-06-11 (×3): qty 1

## 2019-06-11 MED ORDER — SODIUM CHLORIDE 0.9 % IV SOLN
INTRAVENOUS | Status: DC
  Administered 2019-06-11 (×2): via INTRAVENOUS

## 2019-06-11 MED ORDER — MAGNESIUM HYDROXIDE 400 MG/5ML PO SUSP
30.0000 mL | Freq: Every day | ORAL | Status: DC
  Administered 2019-06-12 – 2019-06-13 (×2): 30 mL via ORAL
  Filled 2019-06-11 (×2): qty 30

## 2019-06-11 MED ORDER — CELECOXIB 200 MG PO CAPS
ORAL_CAPSULE | ORAL | Status: AC
  Administered 2019-06-11: 400 mg via ORAL
  Filled 2019-06-11: qty 2

## 2019-06-11 MED ORDER — DIPHENHYDRAMINE HCL 12.5 MG/5ML PO ELIX: 12.5000 mg | ORAL_SOLUTION | ORAL | Status: DC | PRN

## 2019-06-11 MED ORDER — TRANEXAMIC ACID-NACL 1000-0.7 MG/100ML-% IV SOLN
1000.0000 mg | Freq: Once | INTRAVENOUS | Status: AC
  Administered 2019-06-11: 16:00:00 1000 mg via INTRAVENOUS
  Filled 2019-06-11: qty 100

## 2019-06-11 MED ORDER — HYDROCHLOROTHIAZIDE 25 MG PO TABS
25.0000 mg | ORAL_TABLET | Freq: Every day | ORAL | Status: DC
  Administered 2019-06-12 – 2019-06-13 (×2): 25 mg via ORAL
  Filled 2019-06-11 (×2): qty 1

## 2019-06-11 MED ORDER — ROSUVASTATIN CALCIUM 10 MG PO TABS
40.0000 mg | ORAL_TABLET | Freq: Every evening | ORAL | Status: DC
  Administered 2019-06-11 – 2019-06-12 (×2): 40 mg via ORAL
  Filled 2019-06-11 (×2): qty 4

## 2019-06-11 MED ORDER — PHENOL 1.4 % MT LIQD
1.0000 | OROMUCOSAL | Status: DC | PRN
  Filled 2019-06-11: qty 177

## 2019-06-11 MED ORDER — POLYVINYL ALCOHOL 1.4 % OP SOLN
1.0000 [drp] | OPHTHALMIC | Status: DC | PRN
  Administered 2019-06-11 – 2019-06-12 (×2): 1 [drp] via OPHTHALMIC
  Filled 2019-06-11: qty 15

## 2019-06-11 MED ORDER — OXYCODONE HCL 5 MG PO TABS
10.0000 mg | ORAL_TABLET | ORAL | Status: DC | PRN
  Administered 2019-06-11 – 2019-06-12 (×2): 10 mg via ORAL
  Filled 2019-06-11 (×2): qty 2

## 2019-06-11 MED ORDER — EPHEDRINE SULFATE 50 MG/ML IJ SOLN
INTRAMUSCULAR | Status: DC | PRN
  Administered 2019-06-11: 10 mg via INTRAVENOUS

## 2019-06-11 MED ORDER — HYDROMORPHONE HCL 1 MG/ML IJ SOLN: 0.5000 mg | INTRAMUSCULAR | Status: DC | PRN

## 2019-06-11 MED ORDER — NEOMYCIN-POLYMYXIN B GU 40-200000 IR SOLN
Status: DC | PRN
  Administered 2019-06-11: 14 mL

## 2019-06-11 MED ORDER — METOCLOPRAMIDE HCL 10 MG PO TABS: 5.0000 mg | ORAL_TABLET | Freq: Three times a day (TID) | ORAL | Status: DC | PRN

## 2019-06-11 MED ORDER — ACETAMINOPHEN 325 MG PO TABS: 325.0000 mg | ORAL_TABLET | Freq: Four times a day (QID) | ORAL | Status: DC | PRN

## 2019-06-11 MED ORDER — PROPOFOL 500 MG/50ML IV EMUL
INTRAVENOUS | Status: AC
  Filled 2019-06-11: qty 50

## 2019-06-11 MED ORDER — CLINDAMYCIN PHOSPHATE 600 MG/50ML IV SOLN
600.0000 mg | Freq: Four times a day (QID) | INTRAVENOUS | Status: AC
  Administered 2019-06-11 – 2019-06-12 (×4): 600 mg via INTRAVENOUS
  Filled 2019-06-11 (×5): qty 50

## 2019-06-11 MED ORDER — MENTHOL 3 MG MT LOZG
1.0000 | LOZENGE | OROMUCOSAL | Status: DC | PRN
  Filled 2019-06-11: qty 9

## 2019-06-11 MED ORDER — ONDANSETRON HCL 4 MG/2ML IJ SOLN: 4.0000 mg | Freq: Four times a day (QID) | INTRAMUSCULAR | Status: DC | PRN

## 2019-06-11 MED ORDER — BUPIVACAINE HCL (PF) 0.25 % IJ SOLN
INTRAMUSCULAR | Status: DC | PRN
  Administered 2019-06-11: 30 mL

## 2019-06-11 SURGICAL SUPPLY — 67 items
ATTUNE PSFEM RTSZ5 NARCEM KNEE (Femur) ×3 IMPLANT
ATTUNE PSRP INSR SZ5 6 KNEE (Insert) ×2 IMPLANT
ATTUNE PSRP INSR SZ5 6MM KNEE (Insert) ×1 IMPLANT
BASEPLATE TIBIAL ROTATING SZ 4 (Knees) ×3 IMPLANT
BATTERY INSTRU NAVIGATION (MISCELLANEOUS) ×12 IMPLANT
BLADE SAW 70X12.5 (BLADE) ×3 IMPLANT
BLADE SAW 90X13X1.19 OSCILLAT (BLADE) ×3 IMPLANT
BLADE SAW 90X25X1.19 OSCILLAT (BLADE) ×3 IMPLANT
BONE CEMENT GENTAMICIN (Cement) ×6 IMPLANT
CANISTER SUCT 3000ML PPV (MISCELLANEOUS) ×3 IMPLANT
CEMENT BONE GENTAMICIN 40 (Cement) ×2 IMPLANT
COOLER POLAR GLACIER W/PUMP (MISCELLANEOUS) ×3 IMPLANT
COVER WAND RF STERILE (DRAPES) ×3 IMPLANT
CUFF TOURN SGL QUICK 24 (TOURNIQUET CUFF) ×2
CUFF TOURN SGL QUICK 30 (TOURNIQUET CUFF)
CUFF TRNQT CYL 24X4X16.5-23 (TOURNIQUET CUFF) ×1 IMPLANT
CUFF TRNQT CYL 30X4X21-28X (TOURNIQUET CUFF) IMPLANT
DRAPE 3/4 80X56 (DRAPES) ×3 IMPLANT
DRSG DERMACEA 8X12 NADH (GAUZE/BANDAGES/DRESSINGS) ×3 IMPLANT
DRSG OPSITE POSTOP 4X14 (GAUZE/BANDAGES/DRESSINGS) ×3 IMPLANT
DRSG TEGADERM 4X4.75 (GAUZE/BANDAGES/DRESSINGS) ×3 IMPLANT
DURAPREP 26ML APPLICATOR (WOUND CARE) ×6 IMPLANT
ELECT REM PT RETURN 9FT ADLT (ELECTROSURGICAL) ×3
ELECTRODE REM PT RTRN 9FT ADLT (ELECTROSURGICAL) ×1 IMPLANT
EX-PIN ORTHOLOCK NAV 4X150 (PIN) ×6 IMPLANT
GLOVE BIOGEL M STRL SZ7.5 (GLOVE) ×6 IMPLANT
GLOVE INDICATOR 8.0 STRL GRN (GLOVE) ×3 IMPLANT
GOWN STRL REUS W/ TWL LRG LVL3 (GOWN DISPOSABLE) ×2 IMPLANT
GOWN STRL REUS W/TWL LRG LVL3 (GOWN DISPOSABLE) ×4
HEMOVAC 400CC 10FR (MISCELLANEOUS) ×3 IMPLANT
HOLDER FOLEY CATH W/STRAP (MISCELLANEOUS) ×3 IMPLANT
HOOD PEEL AWAY FLYTE STAYCOOL (MISCELLANEOUS) ×6 IMPLANT
KIT TURNOVER KIT A (KITS) ×3 IMPLANT
KNIFE SCULPS 14X20 (INSTRUMENTS) ×3 IMPLANT
LABEL OR SOLS (LABEL) ×3 IMPLANT
MANIFOLD NEPTUNE II (INSTRUMENTS) ×3 IMPLANT
NDL SAFETY ECLIPSE 18X1.5 (NEEDLE) ×1 IMPLANT
NEEDLE HYPO 18GX1.5 SHARP (NEEDLE) ×2
NEEDLE SPNL 20GX3.5 QUINCKE YW (NEEDLE) ×6 IMPLANT
NS IRRIG 500ML POUR BTL (IV SOLUTION) ×3 IMPLANT
PACK TOTAL KNEE (MISCELLANEOUS) ×3 IMPLANT
PAD WRAPON POLAR KNEE (MISCELLANEOUS) ×1 IMPLANT
PATELLA MEDIAL ATTUN 35MM KNEE (Knees) ×3 IMPLANT
PENCIL SMOKE ULTRAEVAC 22 CON (MISCELLANEOUS) ×3 IMPLANT
PIN DRILL QUICK PACK ×3 IMPLANT
PIN FIXATION 1/8DIA X 3INL (PIN) ×9 IMPLANT
PULSAVAC PLUS IRRIG FAN TIP (DISPOSABLE) ×3
SOL .9 NS 3000ML IRR  AL (IV SOLUTION) ×2
SOL .9 NS 3000ML IRR UROMATIC (IV SOLUTION) ×1 IMPLANT
SOL PREP PVP 2OZ (MISCELLANEOUS) ×3
SOLUTION PREP PVP 2OZ (MISCELLANEOUS) ×1 IMPLANT
SPONGE DRAIN TRACH 4X4 STRL 2S (GAUZE/BANDAGES/DRESSINGS) ×3 IMPLANT
STAPLER SKIN PROX 35W (STAPLE) ×3 IMPLANT
STOCKINETTE IMPERV 14X48 (MISCELLANEOUS) ×3 IMPLANT
STRAP TIBIA SHORT (MISCELLANEOUS) ×3 IMPLANT
SUCTION FRAZIER HANDLE 10FR (MISCELLANEOUS) ×2
SUCTION TUBE FRAZIER 10FR DISP (MISCELLANEOUS) ×1 IMPLANT
SUT VIC AB 0 CT1 36 (SUTURE) ×3 IMPLANT
SUT VIC AB 1 CT1 36 (SUTURE) ×6 IMPLANT
SUT VIC AB 2-0 CT2 27 (SUTURE) ×3 IMPLANT
SYR 20ML LL LF (SYRINGE) ×3 IMPLANT
SYR 30ML LL (SYRINGE) ×6 IMPLANT
TIP FAN IRRIG PULSAVAC PLUS (DISPOSABLE) ×1 IMPLANT
TOWEL OR 17X26 4PK STRL BLUE (TOWEL DISPOSABLE) ×3 IMPLANT
TOWER CARTRIDGE SMART MIX (DISPOSABLE) ×3 IMPLANT
TRAY FOLEY MTR SLVR 16FR STAT (SET/KITS/TRAYS/PACK) ×3 IMPLANT
WRAPON POLAR PAD KNEE (MISCELLANEOUS) ×3

## 2019-06-11 NOTE — Op Note (Signed)
OPERATIVE NOTE  DATE OF SURGERY:  06/11/2019  PATIENT NAME:  Sue Hayes   DOB: 1951/06/20  MRN: [id number]  PRE-OPERATIVE DIAGNOSIS: Degenerative arthrosis of the right knee, primary  POST-OPERATIVE DIAGNOSIS:  Same  PROCEDURE:  Right total knee arthroplasty using computer-assisted navigation  SURGEON:  Marciano Sequin. M.D.  ASSISTANT: Kinnie Feil, RNFA (present and scrubbed throughout the case, critical for assistance with exposure, retraction, instrumentation, and closure)  ANESTHESIA: spinal  ESTIMATED BLOOD LOSS: 50 mL  FLUIDS REPLACED: 1000 mL of crystalloid  TOURNIQUET TIME: 94 minutes  DRAINS: 2 medium Hemovac drains  SOFT TISSUE RELEASES: Anterior cruciate ligament, posterior cruciate ligament, deep medial collateral ligament, patellofemoral ligament  IMPLANTS UTILIZED: DePuy Attune size 5N posterior stabilized femoral component (cemented), size 4 rotating platform tibial component (cemented), 35 mm medialized dome patella (cemented), and a 6 mm stabilized rotating platform polyethylene insert.  INDICATIONS FOR SURGERY: Sue Hayes is a 68 y.o. year old female with a long history of progressive knee pain. X-rays demonstrated severe degenerative changes in tricompartmental fashion. The patient had not seen any significant improvement despite conservative nonsurgical intervention. After discussion of the risks and benefits of surgical intervention, the patient expressed understanding of the risks benefits and agree with plans for total knee arthroplasty.   The risks, benefits, and alternatives were discussed at length including but not limited to the risks of infection, bleeding, nerve injury, stiffness, blood clots, the need for revision surgery, cardiopulmonary complications, among others, and they were willing to proceed.  PROCEDURE IN DETAIL: The patient was brought into the operating room and, after adequate spinal anesthesia was achieved, a tourniquet was  placed on the patient's upper thigh. The patient's knee and leg were cleaned and prepped with alcohol and DuraPrep and draped in the usual sterile fashion. A "timeout" was performed as per usual protocol. The lower extremity was exsanguinated using an Esmarch, and the tourniquet was inflated to 300 mmHg. An anterior longitudinal incision was made followed by a standard mid vastus approach. The deep fibers of the medial collateral ligament were elevated in a subperiosteal fashion off of the medial flare of the tibia so as to maintain a continuous soft tissue sleeve. The patella was subluxed laterally and the patellofemoral ligament was incised. Inspection of the knee demonstrated severe degenerative changes with full-thickness loss of articular cartilage. Osteophytes were debrided using a rongeur. Anterior and posterior cruciate ligaments were excised. Two 4.0 mm Schanz pins were inserted in the femur and into the tibia for attachment of the array of trackers used for computer-assisted navigation. Hip center was identified using a circumduction technique. Distal landmarks were mapped using the computer. The distal femur and proximal tibia were mapped using the computer. The distal femoral cutting guide was positioned using computer-assisted navigation so as to achieve a 5 distal valgus cut. The femur was sized and it was felt that a size 5N femoral component was appropriate. A size 5 femoral cutting guide was positioned and the anterior cut was performed and verified using the computer. This was followed by completion of the posterior and chamfer cuts. Femoral cutting guide for the central box was then positioned in the center box cut was performed.  Attention was then directed to the proximal tibia. Medial and lateral menisci were excised. The extramedullary tibial cutting guide was positioned using computer-assisted navigation so as to achieve a 0 varus-valgus alignment and 3 posterior slope. The cut was  performed and verified using the computer. The proximal tibia  was sized and it was felt that a size 4 tibial tray was appropriate. Tibial and femoral trials were inserted followed by insertion of a 6 mm polyethylene insert. This allowed for excellent mediolateral soft tissue balancing both in flexion and in full extension. Finally, the patella was cut and prepared so as to accommodate a 35 mm medialized dome patella. A patella trial was placed and the knee was placed through a range of motion with excellent patellar tracking appreciated. The femoral trial was removed after debridement of posterior osteophytes. The central post-hole for the tibial component was reamed followed by insertion of a keel punch. Tibial trials were then removed. Cut surfaces of bone were irrigated with copious amounts of normal saline with antibiotic solution using pulsatile lavage and then suctioned dry. Polymethylmethacrylate cement with gentamicin was prepared in the usual fashion using a vacuum mixer. Cement was applied to the cut surface of the proximal tibia as well as along the undersurface of a size 4 rotating platform tibial component. Tibial component was positioned and impacted into place. Excess cement was removed using [employer] fast streamer. Cement was then applied to the cut surfaces of the femur as well as along the posterior flanges of the size 5N femoral component. The femoral component was positioned and impacted into place. Excess cement was removed using [employer] fast streamer. A 6 mm polyethylene trial was inserted and the knee was brought into full extension with steady axial compression applied. Finally, cement was applied to the backside of a 35 mm medialized dome patella and the patellar component was positioned and patellar clamp applied. Excess cement was removed using [employer] fast streamer. After adequate curing of the cement, the tourniquet was deflated after a total tourniquet time of 94 minutes. Hemostasis was achieved using  electrocautery. The knee was irrigated with copious amounts of normal saline with antibiotic solution using pulsatile lavage and then suctioned dry. 20 mL of 1.3% Exparel and 60 mL of 0.25% Marcaine in 40 mL of normal saline was injected along the posterior capsule, medial and lateral gutters, and along the arthrotomy site. A 6 mm stabilized rotating platform polyethylene insert was inserted and the knee was placed through a range of motion with excellent mediolateral soft tissue balancing appreciated and excellent patellar tracking noted. 2 medium drains were placed in the wound bed and brought out through separate stab incisions. The medial parapatellar portion of the incision was reapproximated using interrupted sutures of #1 Vicryl. Subcutaneous tissue was approximated in layers using first #0 Vicryl followed #2-0 Vicryl. The skin was approximated with skin staples. A sterile dressing was applied.  The patient tolerated the procedure well and was transported to the recovery room in stable condition.    Quinnlan Abruzzo P. Holley Bouche., M.D.

## 2019-06-11 NOTE — Transfer of Care (Signed)
Immediate Anesthesia Transfer of Care Note  Patient: Sue Hayes  Procedure(s) Performed: COMPUTER ASSISTED TOTAL KNEE ARTHROPLASTY (Right Knee)  Patient Location: PACU  Anesthesia Type:Spinal  Level of Consciousness: awake, alert  and oriented  Airway & Oxygen Therapy: Patient Spontanous Breathing  Post-op Assessment: Report given to RN and Post -op Vital signs reviewed and stable  Post vital signs: Reviewed and stable  Last Vitals:  Vitals Value Taken Time  BP 112/76 06/11/19 1614  Temp    Pulse 77 06/11/19 1614  Resp 18 06/11/19 1614  SpO2 98 % 06/11/19 1614  Vitals shown include unvalidated device data.  Last Pain:  Vitals:   06/11/19 1611  PainSc: (P) 0-No pain         Complications: No apparent anesthesia complications

## 2019-06-11 NOTE — Anesthesia Preprocedure Evaluation (Signed)
Anesthesia Evaluation  Patient identified by MRN, date of birth, ID band Patient awake    Reviewed: Allergy & Precautions, NPO status , Patient's Chart, lab work & pertinent test results  Airway Mallampati: III       Dental   Pulmonary sleep apnea and Continuous Positive Airway Pressure Ventilation ,    Pulmonary exam normal        Cardiovascular hypertension, Normal cardiovascular exam     Neuro/Psych PSYCHIATRIC DISORDERS Anxiety Depression negative neurological ROS     GI/Hepatic Neg liver ROS, GERD  ,  Endo/Other  diabetes  Renal/GU negative Renal ROS  negative genitourinary   Musculoskeletal  (+) Arthritis , Osteoarthritis,    Abdominal Normal abdominal exam  (+)   Peds negative pediatric ROS (+)  Hematology negative hematology ROS (+)   Anesthesia Other Findings Past Medical History: No date: Acid reflux disease No date: Ankle fracture, right No date: Arthritis No date: Cancer [facility])     Comment:  cervical cancer No date: Fracture of capitate bone of wrist No date: HTN (hypertension) No date: Panic disorder with agoraphobia and moderate panic attacks No date: Recurrent major depression-severe ([place]) No date: Sleep apnea     Comment:  CPAP/ dosen't use regularly No date: Type II diabetes mellitus ([place])  Reproductive/Obstetrics                             Anesthesia Physical Anesthesia Plan  ASA: II  Anesthesia Plan: Spinal   Post-op Pain Management:    Induction: Intravenous  PONV Risk Score and Plan:   Airway Management Planned: Nasal Cannula  Additional Equipment:   Intra-op Plan:   Post-operative Plan:   Informed Consent: I have reviewed the patients History and Physical, chart, labs and discussed the procedure including the risks, benefits and alternatives for the proposed anesthesia with the patient or authorized representative who has indicated his/her  understanding and acceptance.     Dental advisory given  Plan Discussed with: CRNA and Surgeon  Anesthesia Plan Comments:         Anesthesia Quick Evaluation

## 2019-06-11 NOTE — H&P (Signed)
The patient has been re-examined, and the chart reviewed, and there have been no interval changes to the documented history and physical.    The risks, benefits, and alternatives have been discussed at length. The patient expressed understanding of the risks benefits and agreed with plans for surgical intervention.  James P. Hooten, Jr. M.D.    

## 2019-06-11 NOTE — Anesthesia Post-op Follow-up Note (Signed)
Anesthesia QCDR form completed.        

## 2019-06-12 ENCOUNTER — Encounter: Payer: Self-pay | Admitting: Orthopedic Surgery

## 2019-06-12 LAB — GLUCOSE, CAPILLARY
Glucose-Capillary: 205 mg/dL — ABNORMAL HIGH (ref 70–99)
Glucose-Capillary: 226 mg/dL — ABNORMAL HIGH (ref 70–99)
Glucose-Capillary: 119 mg/dL — ABNORMAL HIGH (ref 70–99)

## 2019-06-12 MED ORDER — OXYCODONE HCL 5 MG PO TABS: 5.0000 mg | ORAL_TABLET | ORAL | 0 refills | Status: DC | PRN

## 2019-06-12 MED ORDER — CELECOXIB 200 MG PO CAPS: 200.0000 mg | ORAL_CAPSULE | Freq: Two times a day (BID) | ORAL | 1 refills | Status: DC

## 2019-06-12 MED ORDER — ENOXAPARIN SODIUM 40 MG/0.4ML ~~LOC~~ SOLN: 40.0000 mg | SUBCUTANEOUS | 0 refills | Status: DC

## 2019-06-12 MED ORDER — TRAMADOL HCL 50 MG PO TABS: 50.0000 mg | ORAL_TABLET | ORAL | 1 refills | Status: DC | PRN

## 2019-06-12 NOTE — Progress Notes (Signed)
Physical Therapy Treatment Patient Details Name: Sue Hayes MRN: [id number] DOB: 04-29-1951 Today's Date: 06/12/2019    History of Present Illness 68 y.o. female s/p right total knee arthroplasty. PMH includes: cervical cancer, HTN, Type II diabetes, and sleep apnea.    PT Comments    Pt continues to do very well with PT, she was able to circumambulate the nurses station, showed great effort with exercises including resistance with most acts, had ~90 of flexion w/o aggressive overpressure and though she had expected pain and was never limited of otherwise focused on it.  Pt doing well and has surpassed expected POD1 milestones well.     Follow Up Recommendations  Home health PT;Follow surgeon's recommendation for DC plan and follow-up therapies     Equipment Recommendations  None recommended by PT    Recommendations for Other Services       Precautions / Restrictions Precautions Precautions: Fall;Knee Precaution Booklet Issued: No Restrictions Weight Bearing Restrictions: Yes RLE Weight Bearing: Weight bearing as tolerated    Mobility  Bed Mobility Overal bed mobility: Independent             General bed mobility comments: Pt able to get to EOB relatively easily, able to get back to supine post session with similar confidence  Transfers Overall transfer level: Modified independent Equipment used: Rolling walker (2 wheeled)             General transfer comment: continues to need cues to insure appropriate UE use and positioning of L LE, but no direct assist need to/from sit/stand.  Ambulation/Gait Ambulation/Gait assistance: Supervision Gait Distance (Feet): 200 Feet Assistive device: Rolling walker (2 wheeled)       General Gait Details: Pt with consistent speed and cadence while circumambulating the nurses' station.  O2 remains in the high 90s, HR stays below 90 with the effort.   Stairs             Wheelchair Mobility    Modified Rankin  (Stroke Patients Only)       Balance Overall balance assessment: Modified Independent;No apparent balance deficits (not formally assessed)                                          Cognition Arousal/Alertness: Awake/alert Behavior During Therapy: WFL for tasks assessed/performed Overall Cognitive Status: Within Functional Limits for tasks assessed                                        Exercises Total Joint Exercises Ankle Circles/Pumps: Strengthening;10 reps Quad Sets: Strengthening;15 reps Short Arc Quad: Strengthening;15 reps Heel Slides: Strengthening;10 reps Hip ABduction/ADduction: Strengthening;15 reps Straight Leg Raises: Strengthening;10 reps Knee Flexion: PROM;5 reps Goniometric ROM: ~90 Other Exercises Other Exercises: Pt educated on safe use of AE for LB ADL mgt, polar care mgt, falls prevention strategies, compression stocking mgt, and safe use of AE for functional mobility. Handout provided.    General Comments        Pertinent Vitals/Pain Pain Assessment: 0-10 Pain Score: 4  Pain Location: R knee Pain Descriptors / Indicators: Aching;Sore;Guarding Pain Intervention(s): Limited activity within patient's tolerance;Monitored during session;RN gave pain meds during session;Ice applied    Home Living Family/patient expects to be discharged to:: Private residence Living Arrangements: Alone Available Help at Discharge: Family(Dtr will  be staying with pt 24/7) Type of Home: Apartment Home Access: Elevator;Level entry   Home Layout: One level Home Equipment: Walker - 2 wheels;Cane - single point;Shower seat - built in;Grab bars - tub/shower      Prior Function Level of Independence: Independent      Comments: Pt has recently started using SPC becuase of knee pain, but generally is active, independent, driving, etc w/o issue   PT Goals (current goals can now be found in the care plan section) Acute Rehab PT Goals Patient  Stated Goal: get back to walking and independence Progress towards PT goals: Progressing toward goals    Frequency    BID      PT Plan Current plan remains appropriate    Co-evaluation              AM-PAC PT "6 Clicks" Mobility   Outcome Measure  Help needed turning from your back to your side while in a flat bed without using bedrails?: None Help needed moving from lying on your back to sitting on the side of a flat bed without using bedrails?: None Help needed moving to and from a bed to a chair (including a wheelchair)?: None Help needed standing up from a chair using your arms (e.g., wheelchair or bedside chair)?: None Help needed to walk in hospital room?: None Help needed climbing 3-5 steps with a railing? : A Little 6 Click Score: 23    End of Session Equipment Utilized During Treatment: Gait belt Activity Tolerance: Patient tolerated treatment well Patient left: with chair alarm set;with call bell/phone within reach Nurse Communication: Mobility status PT Visit Diagnosis: Muscle weakness (generalized) (M62.81);Difficulty in walking, not elsewhere classified (R26.2);Pain Pain - Right/Left: Right Pain - part of body: Knee     Time: [id number] PT Time Calculation (min) (ACUTE ONLY): 28 min  Charges:  $Gait Training: 8-22 mins $Therapeutic Exercise: 8-22 mins                     Kreg Shropshire, DPT 06/12/2019, 4:17 PM

## 2019-06-12 NOTE — Evaluation (Signed)
Physical Therapy Evaluation Patient Details Name: Sue Hayes MRN: [id number] DOB: Jul 31, 1951 Today's Date: 06/12/2019   History of Present Illness  Ms. Simms is a 68 y.o. female s/p right total knee arthroplasty. PMH includes: cervical cancer, HTN, Type II diabetes, and sleep apnea.  Clinical Impression  Pt did well with PT exam and showed good mobility and strength with ~15 minutes of exercises along with good effort and safety with ~75 ft of ambulation.  Pt with expected pain but was not limited due to this and showed good tolerance and willingness to push through some pain.  Pt eager to do all she can and get home (daughter will be staying with her initially).  Pt has met or exceeded typical POD1 milestones.      Follow Up Recommendations Home health PT;Follow surgeon's recommendation for DC plan and follow-up therapies    Equipment Recommendations  None recommended by PT    Recommendations for Other Services       Precautions / Restrictions Precautions Precautions: Fall;Knee Precaution Booklet Issued: No Restrictions Weight Bearing Restrictions: Yes RLE Weight Bearing: Weight bearing as tolerated      Mobility  Bed Mobility Overal bed mobility: Independent                Transfers Overall transfer level: Modified independent Equipment used: Rolling walker (2 wheeled)             General transfer comment: minimal cuing to insure appropriate positioning/set up, able to rise w/o phyisal assist  Ambulation/Gait Ambulation/Gait assistance: Supervision Gait Distance (Feet): 75 Feet Assistive device: Rolling walker (2 wheeled)       General Gait Details: Pt did well with ambulation into the hallway and was able to maintain consistent cadence with minimal reliance on the walker t/o the effort.  She adjusted stiff leg gait to more fluid and functional cadence with cuing and though she had some fatigue generally did very well with first attempt at ambulation.   No LOBs or safety issues.  Stairs            Wheelchair Mobility    Modified Rankin (Stroke Patients Only)       Balance Overall balance assessment: Modified Independent                                           Pertinent Vitals/Pain Pain Assessment: 0-10 Pain Score: 4  Pain Location: R knee    Home Living Family/patient expects to be discharged to:: Private residence Living Arrangements: Alone Available Help at Discharge: Family(daughter is going to be staying with her 24/7 initially) Type of Home: Apartment Home Access: Elevator;Level entry     Home Layout: One level Home Equipment: [place] - 2 wheels;Cane - single point;Shower seat - built in      Prior Function Level of Independence: Independent         Comments: Pt has recently started using SPC becuase of knee pain, but generally is active, independent, driving, etc w/o issue     Hand Dominance        Extremity/Trunk Assessment   Upper Extremity Assessment Upper Extremity Assessment: Overall WFL for tasks assessed    Lower Extremity Assessment Lower Extremity Assessment: Overall WFL for tasks assessed(expected post-op weakness)       Communication   Communication: No difficulties  Cognition Arousal/Alertness: Awake/alert Behavior During Therapy: WFL for tasks assessed/performed  Overall Cognitive Status: Within Functional Limits for tasks assessed                                        General Comments      Exercises Total Joint Exercises Ankle Circles/Pumps: Strengthening;10 reps Quad Sets: Strengthening;10 reps Short Arc Quad: AROM;Strengthening;10 reps Heel Slides: AROM;10 reps Hip ABduction/ADduction: Strengthening;10 reps Straight Leg Raises: AROM;10 reps Knee Flexion: PROM;5 reps Goniometric ROM: 0-82   Assessment/Plan    PT Assessment Patient needs continued PT services  PT Problem List Decreased strength;Decreased range of  motion;Decreased activity tolerance;Decreased balance;Decreased mobility;Decreased coordination;Decreased knowledge of use of DME;Decreased safety awareness;Pain       PT Treatment Interventions DME instruction;Gait training;Stair training;Functional mobility training;Therapeutic activities;Therapeutic exercise;Balance training;Neuromuscular re-education;Patient/family education    PT Goals (Current goals can be found in the Care Plan section)  Acute Rehab PT Goals Patient Stated Goal: get back to walking and independence PT Goal Formulation: With patient Time For Goal Achievement: 06/26/19 Potential to Achieve Goals: Good    Frequency BID   Barriers to discharge        Co-evaluation               AM-PAC PT "6 Clicks" Mobility  Outcome Measure Help needed turning from your back to your side while in a flat bed without using bedrails?: None Help needed moving from lying on your back to sitting on the side of a flat bed without using bedrails?: None Help needed moving to and from a bed to a chair (including a wheelchair)?: None Help needed standing up from a chair using your arms (e.g., wheelchair or bedside chair)?: None Help needed to walk in hospital room?: None Help needed climbing 3-5 steps with a railing? : A Little 6 Click Score: 23    End of Session Equipment Utilized During Treatment: Gait belt Activity Tolerance: Patient tolerated treatment well Patient left: with chair alarm set;with call bell/phone within reach Nurse Communication: Mobility status PT Visit Diagnosis: Muscle weakness (generalized) (M62.81);Difficulty in walking, not elsewhere classified (R26.2);Pain Pain - Right/Left: Right Pain - part of body: Knee    Time: 0825-0858 PT Time Calculation (min) (ACUTE ONLY): 33 min   Charges:   PT Evaluation $PT Eval Low Complexity: 1 Low PT Treatments $Therapeutic Exercise: 8-22 mins        Kreg Shropshire, DPT 06/12/2019, 12:39 PM

## 2019-06-12 NOTE — TOC Progression Note (Signed)
Transition of Care [facility]) - Progression Note    Patient Details  Name: Sue Hayes MRN: [id number] Date of Birth: 03-13-1951  Transition of Care [facility]) CM/SW [place], RN Phone Number: 06/12/2019, 10:27 AM  Clinical Narrative:     Requested the price of lovenox will notify the patient once obtained       Expected Discharge Plan and Services                                                 Social Determinants of Health (SDOH) Interventions    Readmission Risk Interventions No flowsheet data found.

## 2019-06-12 NOTE — Progress Notes (Signed)
  Subjective: 1 Day Post-Op Procedure(s) (LRB): COMPUTER ASSISTED TOTAL KNEE ARTHROPLASTY (Right) Patient reports pain as mild.   Patient seen in rounds with Dr. Marry Guan. Patient is well, and has had no acute complaints or problems Plan is to go Home after hospital stay. Negative for chest pain and shortness of breath Fever: no Gastrointestinal: Negative for nausea and vomiting  Objective: Vital signs in last 24 hours: Temp:  [96.5 F (35.8 C)-97.9 F (36.6 C)] 97.9 F (36.6 C) (08/27 0334) Pulse Rate:  [68-80] 75 (08/27 0334) Resp:  [14-27] 19 (08/27 0334) BP: (112-151)/(69-87) 126/72 (08/27 0334) SpO2:  [96 %-100 %] 98 % (08/27 0334) Weight:  [88 kg] 88 kg (08/26 1022)  Intake/Output from previous day:  Intake/Output Summary (Last 24 hours) at 06/12/2019 0649 Last data filed at 06/12/2019 0610 Gross per 24 hour  Intake 1476.76 ml  Output 1380 ml  Net 96.76 ml    Intake/Output this shift: Total I/O In: -  Out: 1010 [Urine:890; Drains:120]  Labs: Recent Labs    06/11/19 1030  HGB 14.3   Recent Labs    06/11/19 1030  HCT 42.0   Recent Labs    06/11/19 1030  NA 137  K 4.0  GLUCOSE 155*   No results for input(s): LABPT, INR in the last 72 hours.   EXAM General - Patient is Alert and Oriented Extremity - Neurovascular intact Sensation intact distally Dorsiflexion/Plantar flexion intact Dressing/Incision - clean, dry, with a Hemovac intact Motor Function - intact, moving foot and toes well on exam.  Able to do a straight leg raise independently  Past Medical History:  Diagnosis Date  . Acid reflux disease   . Ankle fracture, right   . Arthritis   . Cancer (HCC)    cervical cancer  . Fracture of capitate bone of wrist   . HTN (hypertension)   . Panic disorder with agoraphobia and moderate panic attacks   . Recurrent major depression-severe ([place])   . Sleep apnea    CPAP/ dosen't use regularly  . Type II diabetes mellitus (HCC)      Assessment/Plan: 1 Day Post-Op Procedure(s) (LRB): COMPUTER ASSISTED TOTAL KNEE ARTHROPLASTY (Right) Active Problems:   Total knee replacement status  Estimated body mass index is 30.38 kg/m as calculated from the following:   Height as of this encounter: 5\' 7"  (1.702 m).   Weight as of this encounter: 88 kg. Advance diet Up with therapy D/C IV fluids Discharge home with home health planned for tomorrow  DVT Prophylaxis - Lovenox, Foot Pumps and TED hose Weight-Bearing as tolerated to right leg  Reche Dixon, PA-C Orthopaedic Surgery 06/12/2019, 6:49 AM

## 2019-06-12 NOTE — Evaluation (Signed)
Occupational Therapy Evaluation Patient Details Name: Sue Hayes MRN: [id number] DOB: 10-22-1950 Today's Date: 06/12/2019    History of Present Illness Sue Hayes is a 68 y.o. female s/p right total knee arthroplasty. PMH includes: cervical cancer, HTN, Type II diabetes, and sleep apnea.   Clinical Impression   Sue Hayes was seen for OT evaluation this date, POD#1 from above surgery. Pt was active and independent in all ADLs prior to surgery, however occasionally using a SPC for functional mobility due to R knee pain. She reports working and driving without difficulty. Pt is eager to return to PLOF with less pain and improved safety and independence. Pt currently requires minimal assist for LB dressing while in seated position due to pain and limited AROM of R knee. Pt instructed in polar care mgt, falls prevention strategies, home/routines modifications, DME/AE for LB bathing and dressing tasks, and compression stocking mgt. Handout provided. Pt would benefit from skilled OT services including additional instruction in dressing techniques with or without assistive devices for dressing and bathing skills to support recall and carryover prior to discharge and ultimately to maximize safety, independence, and minimize falls risk and caregiver burden. Do not currently anticipate any OT needs following this hospitalization.       Follow Up Recommendations  No OT follow up    Equipment Recommendations  None recommended by OT    Recommendations for Other Services       Precautions / Restrictions Precautions Precautions: Fall;Knee Precaution Booklet Issued: No Restrictions Weight Bearing Restrictions: Yes RLE Weight Bearing: Weight bearing as tolerated      Mobility Bed Mobility Overal bed mobility: Independent             General bed mobility comments: Deferred. Pt in room recliner at start/end of session. Per PT (who had just finished session prior to OT eva) pt is independent  for bed mobility. Mod I for functional mobility.  Transfers Overall transfer level: Modified independent Equipment used: Rolling walker (2 wheeled)             General transfer comment: minimal cuing to insure appropriate positioning/set up, able to rise w/o phyisal assist    Balance Overall balance assessment: Modified Independent;No apparent balance deficits (not formally assessed)                                         ADL either performed or assessed with clinical judgement   ADL                                         General ADL Comments: Pt min assist for LB ADL mgt in seated position due to increased pain and decreased ROM of the R knee. Pt able to return demonstrate use of the sock-aid with min VC's for technique. Pt has good seated balance and can reach her feet from sitting.     Vision Baseline Vision/History: Wears glasses Wears Glasses: At all times Patient Visual Report: No change from baseline       Perception     Praxis      Pertinent Vitals/Pain Pain Assessment: 0-10 Pain Score: 6  Pain Location: R knee Pain Descriptors / Indicators: Aching;Sore;Guarding Pain Intervention(s): Limited activity within patient's tolerance;Monitored during session;RN gave pain meds during session;Ice applied  Hand Dominance Right   Extremity/Trunk Assessment Upper Extremity Assessment Upper Extremity Assessment: Overall WFL for tasks assessed   Lower Extremity Assessment Lower Extremity Assessment: Overall WFL for tasks assessed;Defer to PT evaluation       Communication Communication Communication: No difficulties   Cognition Arousal/Alertness: Awake/alert Behavior During Therapy: WFL for tasks assessed/performed Overall Cognitive Status: Within Functional Limits for tasks assessed                                     General Comments       Exercises Exercises: Total Joint Total Joint Exercises Ankle  Circles/Pumps: Strengthening;10 reps Quad Sets: Strengthening;10 reps Short Arc Quad: AROM;Strengthening;10 reps Heel Slides: AROM;10 reps Hip ABduction/ADduction: Strengthening;10 reps Straight Leg Raises: AROM;10 reps Knee Flexion: PROM;5 reps Goniometric ROM: 0-82 Other Exercises Other Exercises: Pt educated on safe use of AE for LB ADL mgt, polar care mgt, falls prevention strategies, compression stocking mgt, and safe use of AE for functional mobility. Handout provided.   Shoulder Instructions      Home Living Family/patient expects to be discharged to:: Private residence Living Arrangements: Alone Available Help at Discharge: Family(Dtr will be staying with pt 24/7) Type of Home: Apartment Home Access: Elevator;Level entry     Home Layout: One level     Bathroom Shower/Tub: Occupational psychologist: Handicapped height Bathroom Accessibility: Yes How Accessible: Accessible via walker Home Equipment: [place] - 2 wheels;Cane - single point;Shower seat - built in;Grab bars - tub/shower          Prior Functioning/Environment Level of Independence: Independent        Comments: Pt has recently started using SPC becuase of knee pain, but generally is active, independent, driving, etc w/o issue        OT Problem List: Decreased strength;Decreased coordination;Decreased range of motion;Pain;Decreased activity tolerance;Decreased safety awareness;Decreased knowledge of use of DME or AE;Decreased knowledge of precautions;Impaired balance (sitting and/or standing)      OT Treatment/Interventions: Self-care/ADL training;Therapeutic exercise;Therapeutic activities;DME and/or AE instruction;Patient/family education;Balance training    OT Goals(Current goals can be found in the care plan section) Acute Rehab OT Goals Patient Stated Goal: get back to walking and independence OT Goal Formulation: With patient Time For Goal Achievement: 06/26/19 Potential to Achieve  Goals: Good ADL Goals Pt Will Perform Lower Body Dressing: with adaptive equipment;with min assist;sit to/from stand(With LRAD PRN for improved safety and functional independence) Pt Will Transfer to Toilet: ambulating;regular height toilet;with supervision(With LRAD PRN for improved safety and functional independence) Pt Will Perform Toileting - Clothing Manipulation and hygiene: with min guard assist;sit to/from stand;with adaptive equipment(With LRAD PRN for improved safety and functional independence)  OT Frequency: Min 1X/week   Barriers to D/C:            Co-evaluation              AM-PAC OT "6 Clicks" Daily Activity     Outcome Measure Help from another person eating meals?: None Help from another person taking care of personal grooming?: None Help from another person toileting, which includes using toliet, bedpan, or urinal?: A Little Help from another person bathing (including washing, rinsing, drying)?: A Little Help from another person to put on and taking off regular upper body clothing?: None Help from another person to put on and taking off regular lower body clothing?: A Little 6 Click Score: 21   End of  Session    Activity Tolerance: Patient tolerated treatment well;No increased pain Patient left: in chair;with call bell/phone within reach;with chair alarm set;with SCD's reapplied;Other (comment)(With polar care in place at start/end of session.)  OT Visit Diagnosis: Other abnormalities of gait and mobility (R26.89);Pain Pain - Right/Left: Right Pain - part of body: Knee                Time: 0902-0928 OT Time Calculation (min): 26 min Charges:  OT General Charges $OT Visit: 1 Visit OT Evaluation $OT Eval Low Complexity: 1 Low OT Treatments $Self Care/Home Management : 23-37 mins  Shara Blazing, M.S., OTR/L Ascom: [phone] 06/12/19, 1:12 PM

## 2019-06-12 NOTE — Anesthesia Postprocedure Evaluation (Signed)
Anesthesia Post Note  Patient: Sue Hayes  Procedure(s) Performed: COMPUTER ASSISTED TOTAL KNEE ARTHROPLASTY (Right Knee)  Patient location during evaluation: Nursing Unit Anesthesia Type: Spinal Level of consciousness: awake, awake and alert, oriented and patient cooperative Pain management: pain level controlled Vital Signs Assessment: post-procedure vital signs reviewed and stable Respiratory status: spontaneous breathing, nonlabored ventilation and respiratory function stable Cardiovascular status: stable Postop Assessment: no headache, patient able to bend at knees, no apparent nausea or vomiting, adequate PO intake and able to ambulate Anesthetic complications: no     Last Vitals:  Vitals:   06/12/19 0334 06/12/19 0746  BP: 126/72 124/71  Pulse: 75 73  Resp: 19   Temp: 36.6 C 36.6 C  SpO2: 98% 100%    Last Pain:  Vitals:   06/12/19 0825  TempSrc:   PainSc: 3                  Sue Hayes,  Sue Hayes

## 2019-06-12 NOTE — TOC Benefit Eligibility Note (Signed)
Transition of Care [facility]) Benefit Eligibility Note    Patient Details  Name: Sue Hayes MRN: [id number] Date of Birth: [birth year]   Medication/Dose: Enoxaparin 40mg  once daily for 14 days  Covered?: Yes  Tier: Other(Tier 4)  Prescription Coverage Preferred Pharmacy: [facility]  Spoke with Person/Company/Phone Number:: [phone] with [employer] at [phone]  Co-Pay: $100 estimated copay for 30 day supply. Unable to give copay for 14 day supply.  Prior Approval: No  Deductible: Unmet($50 deductible applies to Tiers 3, 4, and 5.  Unmet as of time of call.)   Dannette Barbara Phone Number: [phone] or [phone] 06/12/2019, 2:04 PM

## 2019-06-13 LAB — GLUCOSE, CAPILLARY
Glucose-Capillary: 156 mg/dL — ABNORMAL HIGH (ref 70–99)
Glucose-Capillary: 166 mg/dL — ABNORMAL HIGH (ref 70–99)
Glucose-Capillary: 136 mg/dL — ABNORMAL HIGH (ref 70–99)

## 2019-06-13 NOTE — Discharge Summary (Addendum)
Physician Discharge Summary  Subjective: 2 Days Post-Op Procedure(s) (LRB): COMPUTER ASSISTED TOTAL KNEE ARTHROPLASTY (Right) Patient reports pain as moderate.   Patient seen in rounds with Dr. Marry Guan. Patient is well, and has had no acute complaints or problems Patient is ready to go home with home health physical therapy  Physician Discharge Summary  Patient ID: Sue Hayes MRN: [id number] DOB/AGE: 1951/01/09 68 y.o.  Admit date: 06/11/2019 Discharge date: 06/13/2019  Admission Diagnoses:  Discharge Diagnoses:  Active Problems:   Total knee replacement status   Discharged Condition: fair  Hospital Course: The patient is postop day 2 from a right total knee replacement.  She has done very well since surgery.  Her vitals have remained stable.  She is still working on a bowel movement.  She ambulated 200 feet with physical therapy and flex her knee to 90 degrees.  Her Hemovac tubing was removed today.  She is ready to go home.  Treatments: surgery:  Right total knee arthroplasty using computer-assisted navigation  SURGEON:  Marciano Sequin. M.D.  ASSISTANT: Kinnie Feil, RNFA (present and scrubbed throughout the case, critical for assistance with exposure, retraction, instrumentation, and closure)  ANESTHESIA: spinal  ESTIMATED BLOOD LOSS: 50 mL  FLUIDS REPLACED: 1000 mL of crystalloid  TOURNIQUET TIME: 94 minutes  DRAINS: 2 medium Hemovac drains  SOFT TISSUE RELEASES: Anterior cruciate ligament, posterior cruciate ligament, deep medial collateral ligament, patellofemoral ligament  IMPLANTS UTILIZED: DePuy Attune size 5N posterior stabilized femoral component (cemented), size 4 rotating platform tibial component (cemented), 35 mm medialized dome patella (cemented), and a 6 mm stabilized rotating platform polyethylene insert.  Discharge Exam: Blood pressure 132/70, pulse 69, temperature 97.9 F (36.6 C), temperature source Oral, resp. rate 18, height  5\' 7"  (1.702 m), weight 88 kg, SpO2 100 %.   Disposition: Discharge disposition: 01-Home or Self Care        Allergies as of 06/13/2019      Reactions   Penicillins Shortness Of Breath   Did it involve swelling of the face/tongue/throat, SOB, or low BP? Yes Did it involve sudden or severe rash/hives, skin peeling, or any reaction on the inside of your mouth or nose? No Did you need to seek medical attention at a hospital or doctor's office? Yes When did it last happen?40 + years If all above answers are "NO", may proceed with cephalosporin use.   Zolpidem Other (See Comments)   Sleep walking      Medication List    STOP taking these medications   ibuprofen 200 MG tablet Commonly known as: ADVIL     TAKE these medications   acetaminophen 500 MG tablet Commonly known as: TYLENOL Take 500 mg by mouth 2 (two) times daily as needed for moderate pain or headache.   atenolol 25 MG tablet Commonly known as: TENORMIN Take 25 mg by mouth daily.   busPIRone 5 MG tablet Commonly known as: BUSPAR Take 5 mg by mouth 2 (two) times daily.   CALTRATE 600+D PO Take 1 tablet by mouth 2 (two) times daily.   celecoxib 200 MG capsule Commonly known as: CELEBREX Take 1 capsule (200 mg total) by mouth 2 (two) times daily.   DULoxetine 60 MG capsule Commonly known as: CYMBALTA Take 60 mg by mouth 2 (two) times daily.   enalapril 10 MG tablet Commonly known as: VASOTEC Take 10 mg by mouth daily.   enoxaparin 40 MG/0.4ML injection Commonly known as: LOVENOX Inject 0.4 mLs (40 mg total) into the  skin daily for 14 doses.   esomeprazole 40 MG capsule Commonly known as: NEXIUM Take 40 mg by mouth daily.   hydrochlorothiazide 25 MG tablet Commonly known as: HYDRODIURIL Take 25 mg by mouth daily.   Iron 27 240 (27 FE) MG tablet Generic drug: ferrous gluconate Take 240 mg by mouth at bedtime.   metFORMIN 500 MG tablet Commonly known as: GLUCOPHAGE Take 500 mg by mouth 2  (two) times daily.   oxyCODONE 5 MG immediate release tablet Commonly known as: Oxy IR/ROXICODONE Take 1 tablet (5 mg total) by mouth every 4 (four) hours as needed for moderate pain (pain score 4-6).   rosuvastatin 40 MG tablet Commonly known as: CRESTOR Take 40 mg by mouth every evening.   traMADol 50 MG tablet Commonly known as: ULTRAM Take 1-2 tablets (50-100 mg total) by mouth every 4 (four) hours as needed for moderate pain.            Durable Medical Equipment  (From admission, onward)         Start     Ordered   06/11/19 1721  DME Walker rolling  Once    Question:  Patient needs a walker to treat with the following condition  Answer:  Total knee replacement status   06/11/19 1720   06/11/19 1721  DME Bedside commode  Once    Question:  Patient needs a bedside commode to treat with the following condition  Answer:  Total knee replacement status   06/11/19 1720         Follow-up Information    Watt Climes, PA On 06/25/2019.   Specialty: Physician Assistant Why: at 10:15am Contact information: [place] [address] [phone]        Dereck Leep, MD On 07/24/2019.   Specialty: Orthopedic Surgery Why: at 9:30am Contact information: [place] [address] [phone]           Signed: Prescott Parma, TODD 06/13/2019, 6:52 AM   Objective: Vital signs in last 24 hours: Temp:  [97.8 F (36.6 C)-98.6 F (37 C)] 97.9 F (36.6 C) (08/27 2337) Pulse Rate:  [65-77] 69 (08/27 2337) Resp:  [18] 18 (08/27 2337) BP: (124-156)/(65-74) 132/70 (08/27 2337) SpO2:  [97 %-100 %] 100 % (08/27 2337)  Intake/Output from previous day:  Intake/Output Summary (Last 24 hours) at 06/13/2019 0652 Last data filed at 06/13/2019 0622 Gross per 24 hour  Intake 1626.37 ml  Output 155 ml  Net 1471.37 ml    Intake/Output this shift: Total I/O In: -  Out: 60 [Drains:60]  Labs: Recent Labs     06/11/19 1030  HGB 14.3   Recent Labs    06/11/19 1030  HCT 42.0   Recent Labs    06/11/19 1030  NA 137  K 4.0  GLUCOSE 155*   No results for input(s): LABPT, INR in the last 72 hours.  EXAM: General - Patient is Alert and Oriented Extremity - Neurovascular intact Sensation intact distally Dorsiflexion/Plantar flexion intact Compartment soft Incision - clean, dry, with the Hemovac tube removed.  The Hemovac tubing was intact on removal. Motor Function -plantarflexion and dorsiflexion are intact.  Able to straight leg raise independently.  Assessment/Plan: 2 Days Post-Op Procedure(s) (LRB): COMPUTER ASSISTED TOTAL KNEE ARTHROPLASTY (Right) Procedure(s) (LRB): COMPUTER ASSISTED TOTAL KNEE ARTHROPLASTY (Right) Past Medical History:  Diagnosis Date  . Acid reflux disease   . Ankle fracture, right   . Arthritis   .  Cancer (HCC)    cervical cancer  . Fracture of capitate bone of wrist   . HTN (hypertension)   . Panic disorder with agoraphobia and moderate panic attacks   . Recurrent major depression-severe ([place])   . Sleep apnea    CPAP/ dosen't use regularly  . Type II diabetes mellitus (HCC)    Active Problems:   Total knee replacement status  Estimated body mass index is 30.38 kg/m as calculated from the following:   Height as of this encounter: 5\' 7"  (1.702 m).   Weight as of this encounter: 88 kg. Advance diet Up with therapy D/C IV fluids Discharge home with home health Diet - Regular diet Follow up - in 2 weeks Activity - WBAT Disposition - Home Condition Upon Discharge - Stable DVT Prophylaxis - Lovenox and TED hose  Reche Dixon, PA-C Orthopaedic Surgery 06/13/2019, 6:52 AM

## 2019-06-13 NOTE — Progress Notes (Signed)
  Subjective: 2 Days Post-Op Procedure(s) (LRB): COMPUTER ASSISTED TOTAL KNEE ARTHROPLASTY (Right) Patient reports pain as mild to moderate Patient seen in rounds with Dr. Marry Guan. Patient is well, and has had no acute complaints or problems Plan is to go Home after hospital stay. Negative for chest pain and shortness of breath Fever: no Gastrointestinal: Negative for nausea and vomiting  Objective: Vital signs in last 24 hours: Temp:  [97.8 F (36.6 C)-98.6 F (37 C)] 97.9 F (36.6 C) (08/27 2337) Pulse Rate:  [65-77] 69 (08/27 2337) Resp:  [18] 18 (08/27 2337) BP: (124-156)/(65-74) 132/70 (08/27 2337) SpO2:  [97 %-100 %] 100 % (08/27 2337)  Intake/Output from previous day:  Intake/Output Summary (Last 24 hours) at 06/13/2019 0650 Last data filed at 06/13/2019 0622 Gross per 24 hour  Intake 1626.37 ml  Output 155 ml  Net 1471.37 ml    Intake/Output this shift: Total I/O In: -  Out: 60 [Drains:60]  Labs: Recent Labs    06/11/19 1030  HGB 14.3   Recent Labs    06/11/19 1030  HCT 42.0   Recent Labs    06/11/19 1030  NA 137  K 4.0  GLUCOSE 155*   No results for input(s): LABPT, INR in the last 72 hours.   EXAM General - Patient is Alert and Oriented Extremity - Neurovascular intact Sensation intact distally Dorsiflexion/Plantar flexion intact Dressing/Incision - clean, dry, with the Hemovac removed.  The Hemovac tubing was intact on removal. Motor Function - intact, moving foot and toes well on exam.  Able to do a straight leg raise independently.  Ambulated 200 feet with physical therapy and flex her knee to 90 degrees.  Past Medical History:  Diagnosis Date  . Acid reflux disease   . Ankle fracture, right   . Arthritis   . Cancer (HCC)    cervical cancer  . Fracture of capitate bone of wrist   . HTN (hypertension)   . Panic disorder with agoraphobia and moderate panic attacks   . Recurrent major depression-severe ([place])   . Sleep apnea    CPAP/  dosen't use regularly  . Type II diabetes mellitus (HCC)     Assessment/Plan: 2 Days Post-Op Procedure(s) (LRB): COMPUTER ASSISTED TOTAL KNEE ARTHROPLASTY (Right) Active Problems:   Total knee replacement status  Estimated body mass index is 30.38 kg/m as calculated from the following:   Height as of this encounter: 5\' 7"  (1.702 m).   Weight as of this encounter: 88 kg. Advance diet Up with therapy D/C IV fluids Discharge home with home health planned for today Continue bowel management.  DVT Prophylaxis - Lovenox, Foot Pumps and TED hose Weight-Bearing as tolerated to right leg  Reche Dixon, PA-C Orthopaedic Surgery 06/13/2019, 6:50 AM

## 2019-06-13 NOTE — Progress Notes (Signed)
Physical Therapy Treatment Patient Details Name: Sue Hayes MRN: [id number] DOB: 25-May-1951 Today's Date: 06/13/2019    History of Present Illness 68 y.o. female s/p right total knee arthroplasty. PMH includes: cervical cancer, HTN, Type II diabetes, and sleep apnea.    PT Comments    Pt was able to ambulate with confidence and consistent cadence, negotiated steps w/o issue.  She showed great quad control and good effort and strength with exercises.  ROM >90 degrees without excessive pain, pt's O2 remained in the high 90s with prolonged ambulation and overall pt is making steady and appropriate improvement.     Follow Up Recommendations  Home health PT;Follow surgeon's recommendation for DC plan and follow-up therapies     Equipment Recommendations  None recommended by PT    Recommendations for Other Services       Precautions / Restrictions Precautions Precautions: Fall;Knee Precaution Booklet Issued: Yes (comment)(HEP) Restrictions Weight Bearing Restrictions: Yes RLE Weight Bearing: Weight bearing as tolerated    Mobility  Bed Mobility Overal bed mobility: Independent             General bed mobility comments: Pt in recliner on arrival  Transfers Overall transfer level: Independent Equipment used: Rolling walker (2 wheeled)             General transfer comment: Pt did well with transferring to/from standing, good set up and sequencing awareness this date  Ambulation/Gait Ambulation/Gait assistance: Supervision Gait Distance (Feet): 250 Feet Assistive device: Rolling walker (2 wheeled)       General Gait Details: Pt with consistent speed and cadence while circumambulating the nurses' station.  O2 remains in the high 90s, HR stays below 90 with the effort.  Pt with improved consistency and great confidence this date.   Stairs Stairs: Yes Stairs assistance: Modified independent (Device/Increase time) Stair Management: Two rails Number of Stairs:  4 General stair comments: Pt able to negotiate steps w/o issue, reports she was using step-to strategy before sx secondary to knee pain   Wheelchair Mobility    Modified Rankin (Stroke Patients Only)       Balance Overall balance assessment: Independent                                          Cognition Arousal/Alertness: Awake/alert Behavior During Therapy: WFL for tasks assessed/performed Overall Cognitive Status: Within Functional Limits for tasks assessed                                        Exercises Total Joint Exercises Ankle Circles/Pumps: Strengthening;10 reps Quad Sets: Strengthening;15 reps Heel Slides: Strengthening;10 reps Hip ABduction/ADduction: Strengthening;15 reps Straight Leg Raises: Strengthening;10 reps Long Arc Quad: Strengthening;10 reps Knee Flexion: PROM;5 reps Goniometric ROM: 0-94 Other Exercises Other Exercises: Pt assisted with LB ADL mgt using AE on this date. Pt was able to don underwear using a LH reacher with supervision for safety and min VC's for technique. During session, pt also independently donned a bra and "house dress" in sitting. Other Exercises: OT provided falls prevention education during functional mobility on this date. Pt able to navigate tight spaces in room with good overall safety awareness and safe technique with RW. Other Exercises: Pt completed toilet transfer with supervision for safety. Independent with peri-care on this date. Supervision and  min VC's for STS technique from [facility].    General Comments        Pertinent Vitals/Pain Pain Assessment: No/denies pain Pain Score: 5  Pain Location: R knee Pain Descriptors / Indicators: Sore;Guarding;Constant Pain Intervention(s): Limited activity within patient's tolerance;Monitored during session;Ice applied;Repositioned    Home Living                      Prior Function            PT Goals (current goals can now be found  in the care plan section) Acute Rehab PT Goals Patient Stated Goal: get back to walking and independence Progress towards PT goals: Progressing toward goals    Frequency    BID      PT Plan Current plan remains appropriate    Co-evaluation              AM-PAC PT "6 Clicks" Mobility   Outcome Measure  Help needed turning from your back to your side while in a flat bed without using bedrails?: None Help needed moving from lying on your back to sitting on the side of a flat bed without using bedrails?: None Help needed moving to and from a bed to a chair (including a wheelchair)?: None Help needed standing up from a chair using your arms (e.g., wheelchair or bedside chair)?: None Help needed to walk in hospital room?: None Help needed climbing 3-5 steps with a railing? : None 6 Click Score: 24    End of Session Equipment Utilized During Treatment: Gait belt Activity Tolerance: Patient tolerated treatment well Patient left: with chair alarm set;with call bell/phone within reach Nurse Communication: Mobility status PT Visit Diagnosis: Muscle weakness (generalized) (M62.81);Difficulty in walking, not elsewhere classified (R26.2);Pain Pain - Right/Left: Right Pain - part of body: Knee     Time: 0912-0940 PT Time Calculation (min) (ACUTE ONLY): 28 min  Charges:  $Gait Training: 8-22 mins $Therapeutic Exercise: 8-22 mins                     Kreg Shropshire, DPT 06/13/2019, 10:54 AM

## 2019-06-13 NOTE — Progress Notes (Signed)
Occupational Therapy Treatment Patient Details Name: Sue Hayes MRN: [id number] DOB: 01-10-1951 Today's Date: 06/13/2019    History of present illness 68 y.o. female s/p right total knee arthroplasty. PMH includes: cervical cancer, HTN, Type II diabetes, and sleep apnea.   OT comments  Ms. Frisinger was seen for OT treatment on this date. Upon arrival to room pt awake/alert seated upright in bed. Pt A&O x 4 reporting 6/10 pain, and agreeable to OT tx. OT provided brief review of use of LH reacher for LB ADL mgt. Pt return demonstrated understanding of instruction provided by using [place] reacher to don underwear on this date with min VC's for technique and supervision for safety. Afterward, pt requesting to "move around a little bit". Pt demonstrated good safety awareness and technique with RW while completed functional mobility over approximately 160' on this date. Pt also able to complete toilet transfer with min VC's for safety (See below for any additional details). Pt making very good progress toward goals and continues to benefit from skilled OT services to maximize return to PLOF and minimize risk of future falls, injury, caregiver burden, and readmission. Will continue to follow POC as written. Discharge recommendation remains appropriate.    Follow Up Recommendations  No OT follow up    Equipment Recommendations  None recommended by OT    Recommendations for Other Services      Precautions / Restrictions Precautions Precautions: Fall;Knee Precaution Booklet Issued: No Restrictions Weight Bearing Restrictions: Yes RLE Weight Bearing: Weight bearing as tolerated       Mobility Bed Mobility Overal bed mobility: Independent             General bed mobility comments: Pt completed sup>sit using bed rails with HOB elevated with relative ease despite 6/10 pain in RLE.  Transfers Overall transfer level: Modified independent Equipment used: Rolling walker (2 wheeled)              General transfer comment: Pt demonstrated overall good hand placement and UE use during STS transfers on this date. Independently remembered to reach back for arm rests when sitting in room recliner x2 on this date.    Balance Overall balance assessment: Modified Independent;No apparent balance deficits (not formally assessed)                                         ADL either performed or assessed with clinical judgement   ADL                                         General ADL Comments: Pt donned underewear with supervision for safety and min VC's for technique with using the [place]. Pt was able to independently complete upper body dressing with good safety awareness. She completed STS t/f during dressing with good hand placement and technique when using a RW. She amb. to her room bathroom and completed a toilet transfer given supervision for safety and min VC to reach for her hand rails before sitting on the [facility] placed over room toilet.     Vision Baseline Vision/History: Wears glasses Wears Glasses: At all times Patient Visual Report: No change from baseline     Perception     Praxis      Cognition Arousal/Alertness: Awake/alert Behavior During Therapy: [facility] for  tasks assessed/performed Overall Cognitive Status: Within Functional Limits for tasks assessed                                          Exercises Other Exercises Other Exercises: Pt assisted with LB ADL mgt using AE on this date. Pt was able to don underwear using a LH reacher with supervision for safety and min VC's for technique. During session, pt also independently donned a bra and "house dress" in sitting. Other Exercises: OT provided falls prevention education during functional mobility on this date. Pt able to navigate tight spaces in room with good overall safety awareness and safe technique with RW. Other Exercises: Pt completed toilet transfer with  supervision for safety. Independent with peri-care on this date. Supervision and min VC's for STS technique from [facility].   Shoulder Instructions       General Comments      Pertinent Vitals/ Pain       Pain Score: 6  Pain Location: R knee Pain Descriptors / Indicators: Sore;Guarding;Constant Pain Intervention(s): Limited activity within patient's tolerance;Monitored during session;Ice applied;Repositioned  Home Living                                          Prior Functioning/Environment              Frequency  Min 1X/week        Progress Toward Goals  OT Goals(current goals can now be found in the care plan section)  Progress towards OT goals: Progressing toward goals  Acute Rehab OT Goals Patient Stated Goal: get back to walking and independence OT Goal Formulation: With patient Time For Goal Achievement: 06/26/19 Potential to Achieve Goals: Good  Plan Discharge plan remains appropriate;Frequency remains appropriate    Co-evaluation                 AM-PAC OT "6 Clicks" Daily Activity     Outcome Measure   Help from another person eating meals?: None Help from another person taking care of personal grooming?: None Help from another person toileting, which includes using toliet, bedpan, or urinal?: A Little Help from another person bathing (including washing, rinsing, drying)?: A Little Help from another person to put on and taking off regular upper body clothing?: None Help from another person to put on and taking off regular lower body clothing?: A Little 6 Click Score: 21    End of Session Equipment Utilized During Treatment: Gait belt;Rolling walker  OT Visit Diagnosis: Other abnormalities of gait and mobility (R26.89);Pain Pain - Right/Left: Right Pain - part of body: Knee   Activity Tolerance Patient tolerated treatment well;No increased pain   Patient Left in chair;with call bell/phone within reach;with chair alarm  set;Other (comment)(With polar care in place.)   Nurse Communication          Time: [phone] OT Time Calculation (min): 38 min  Charges: OT General Charges $OT Visit: 1 Visit OT Treatments $Self Care/Home Management : 38-52 mins  Shara Blazing, M.S., OTR/L Ascom: [phone] 06/13/19, 9:27 AM

## 2019-06-13 NOTE — Progress Notes (Signed)
Pt discharged home via private vehicle. discharge instruction given and questions answered. Right leg washed and dressing changed, ted hose applied to left leg prior to discharge.

## 2019-09-22 ENCOUNTER — Other Ambulatory Visit: Payer: Self-pay | Admitting: Nephrology

## 2019-09-22 DIAGNOSIS — N1831 Chronic kidney disease, stage 3a: Secondary | ICD-10-CM

## 2019-11-04 ENCOUNTER — Encounter: Payer: Self-pay | Admitting: Oncology

## 2019-11-04 ENCOUNTER — Other Ambulatory Visit: Payer: Self-pay

## 2019-11-04 NOTE — Progress Notes (Signed)
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 Pt aware of referral and reason of referral. She was prescreened for appt. Pt with family history of melanoma & uterine cancer. Pt reports that calcium pill was discontinued by Dr. Holley Raring due to increased calcium level. Pt also complains of increased tiredness and indigestion for about 1 week. All questions & concerns answered.

## 2019-11-05 ENCOUNTER — Ambulatory Visit
Admission: RE | Admit: 2019-11-05 | Discharge: 2019-11-05 | Disposition: A | Discharge status: Home / Self Care | Payer: Medicare Other | Source: Ambulatory Visit | Attending: Oncology | Admitting: Oncology

## 2019-11-05 ENCOUNTER — Inpatient Hospital Stay: Payer: Medicare Other

## 2019-11-05 ENCOUNTER — Inpatient Hospital Stay: Payer: Medicare Other | Attending: Oncology | Admitting: Oncology

## 2019-11-05 ENCOUNTER — Other Ambulatory Visit: Payer: Self-pay

## 2019-11-05 ENCOUNTER — Encounter (INDEPENDENT_AMBULATORY_CARE_PROVIDER_SITE_OTHER): Payer: Self-pay

## 2019-11-05 VITALS — BP 133/77 | HR 73 | Temp 96.7°F | Resp 18 | Ht 67.0 in | Wt 185.1 lb

## 2019-11-05 DIAGNOSIS — D649 Anemia, unspecified: Secondary | ICD-10-CM

## 2019-11-05 DIAGNOSIS — Z7984 Long term (current) use of oral hypoglycemic drugs: Secondary | ICD-10-CM | POA: Diagnosis not present

## 2019-11-05 DIAGNOSIS — K219 Gastro-esophageal reflux disease without esophagitis: Secondary | ICD-10-CM | POA: Insufficient documentation

## 2019-11-05 DIAGNOSIS — E1122 Type 2 diabetes mellitus with diabetic chronic kidney disease: Secondary | ICD-10-CM | POA: Diagnosis not present

## 2019-11-05 DIAGNOSIS — N189 Chronic kidney disease, unspecified: Secondary | ICD-10-CM | POA: Insufficient documentation

## 2019-11-05 DIAGNOSIS — D472 Monoclonal gammopathy: Secondary | ICD-10-CM | POA: Diagnosis not present

## 2019-11-05 DIAGNOSIS — R5383 Other fatigue: Secondary | ICD-10-CM

## 2019-11-05 DIAGNOSIS — F339 Major depressive disorder, recurrent, unspecified: Secondary | ICD-10-CM | POA: Diagnosis not present

## 2019-11-05 DIAGNOSIS — I129 Hypertensive chronic kidney disease with stage 1 through stage 4 chronic kidney disease, or unspecified chronic kidney disease: Secondary | ICD-10-CM | POA: Insufficient documentation

## 2019-11-05 DIAGNOSIS — M1990 Unspecified osteoarthritis, unspecified site: Secondary | ICD-10-CM | POA: Diagnosis not present

## 2019-11-05 DIAGNOSIS — Z791 Long term (current) use of non-steroidal anti-inflammatories (NSAID): Secondary | ICD-10-CM | POA: Insufficient documentation

## 2019-11-05 DIAGNOSIS — F4001 Agoraphobia with panic disorder: Secondary | ICD-10-CM | POA: Insufficient documentation

## 2019-11-05 DIAGNOSIS — Z79899 Other long term (current) drug therapy: Secondary | ICD-10-CM | POA: Insufficient documentation

## 2019-11-05 LAB — CBC WITH DIFFERENTIAL/PLATELET
Abs Immature Granulocytes: 0.02 10*3/uL (ref 0.00–0.07)
Basophils Absolute: 0.1 10*3/uL (ref 0.0–0.1)
Basophils Relative: 1 %
Eosinophils Absolute: 0.1 10*3/uL (ref 0.0–0.5)
Eosinophils Relative: 1 %
HCT: 36.4 % (ref 36.0–46.0)
Hemoglobin: 11.3 g/dL — ABNORMAL LOW (ref 12.0–15.0)
Immature Granulocytes: 0 %
Lymphocytes Relative: 28 %
Lymphs Abs: 2.6 10*3/uL (ref 0.7–4.0)
MCH: 26.5 pg (ref 26.0–34.0)
MCHC: 31 g/dL (ref 30.0–36.0)
MCV: 85.4 fL (ref 80.0–100.0)
Monocytes Absolute: 0.7 10*3/uL (ref 0.1–1.0)
Monocytes Relative: 7 %
Neutro Abs: 5.8 10*3/uL (ref 1.7–7.7)
Neutrophils Relative %: 63 %
Platelets: 394 10*3/uL (ref 150–400)
RBC: 4.26 MIL/uL (ref 3.87–5.11)
RDW: 15.7 % — ABNORMAL HIGH (ref 11.5–15.5)
WBC: 9.2 10*3/uL (ref 4.0–10.5)
nRBC: 0 % (ref 0.0–0.2)

## 2019-11-05 LAB — HEPATIC FUNCTION PANEL
ALT: 19 U/L (ref 0–44)
AST: 20 U/L (ref 15–41)
Albumin: 4.3 g/dL (ref 3.5–5.0)
Alkaline Phosphatase: 62 U/L (ref 38–126)
Bilirubin, Direct: <0.1 mg/dL (ref 0.0–0.2)
Total Bilirubin: 0.8 mg/dL (ref 0.3–1.2)
Total Protein: 8.6 g/dL — ABNORMAL HIGH (ref 6.5–8.1)

## 2019-11-05 LAB — TSH: TSH: 3.098 u[IU]/mL (ref 0.350–4.500)

## 2019-11-05 NOTE — Progress Notes (Signed)
Hematology/Oncology Consult note [facility] Telephone:([phone] [phone]   Patient Care Team: Gayland Curry, MD as PCP - General (Family Medicine)  REFERRING PROVIDER: Anthonette Legato, MD  CHIEF COMPLAINTS/REASON FOR VISIT:  Evaluation of abnormal protein electrophoresis  HISTORY OF PRESENTING ILLNESS:   Sue Hayes is a  69 y.o.  female with PMH listed below was seen in consultation at the request of  Holley Raring, Munsoor, MD  for evaluation of abnormal protein electrophoresis. Patient follows up with Dr. Holley Raring for chronic kidney disease.  She has history of hypertension and diabetes. As part of the CKD work-up, serum protein electrophoresis showed abnormal monoclonal spike 0.8 g/dL.  Total protein 8.2. Patient was referred to me for additional work-up and evaluation. Patient reports feeling tired and fatigued for the past few months.  Appetite is not good.  Denies any pain, fever, chills, night sweating, back pain. She was accompanied by her daughter  Review of Systems  Constitutional: Positive for appetite change and fatigue. Negative for chills and fever.  HENT:   Negative for hearing loss and voice change.   Eyes: Negative for eye problems.  Respiratory: Negative for chest tightness and cough.   Cardiovascular: Negative for chest pain.  Gastrointestinal: Negative for abdominal distention, abdominal pain and blood in stool.  Endocrine: Negative for hot flashes.  Genitourinary: Negative for difficulty urinating and frequency.   Musculoskeletal: Negative for arthralgias.  Skin: Negative for itching and rash.  Neurological: Negative for extremity weakness.  Hematological: Negative for adenopathy.  Psychiatric/Behavioral: Negative for confusion.    MEDICAL HISTORY:  Past Medical History:  Diagnosis Date  . Acid reflux disease   . Ankle fracture, right   . Arthritis   . Cancer (HCC)    cervical cancer  . Fracture of capitate bone of  wrist   . HTN (hypertension)   . Panic disorder with agoraphobia and moderate panic attacks   . Recurrent major depression-severe ([place])   . Sleep apnea    CPAP/ dosen't use regularly  . Type II diabetes mellitus ([place])     SURGICAL HISTORY: Past Surgical History:  Procedure Laterality Date  . CHOLECYSTECTOMY    . KNEE ARTHROPLASTY Right 06/11/2019   Procedure: COMPUTER ASSISTED TOTAL KNEE ARTHROPLASTY;  Surgeon: Dereck Leep, MD;  Location: [facility] ORS;  Service: Orthopedics;  Laterality: Right;  . KNEE ARTHROSCOPY Bilateral   . TUBAL LIGATION      SOCIAL HISTORY: Social History   Socioeconomic History  . Marital status: Divorced    Spouse name: Not on file  . Number of children: Not on file  . Years of education: Not on file  . Highest education level: Not on file  Occupational History  . Not on file  Tobacco Use  . Smoking status: Never Smoker  . Smokeless tobacco: Never Used  Substance and Sexual Activity  . Alcohol use: Not Currently    Alcohol/week: 5.0 standard drinks    Types: 5 Glasses of wine per week  . Drug use: Never  . Sexual activity: Not on file  Other Topics Concern  . Not on file  Social History Narrative  . Not on file   Social Determinants of Health   Financial Resource Strain:   . Difficulty of Paying Living Expenses: Not on file  Food Insecurity:   . Worried About Charity fundraiser in the Last Year: Not on file  . Ran Out of Food in the Last Year: Not on file  Transportation Needs:   .  Lack of Transportation (Medical): Not on file  . Lack of Transportation (Non-Medical): Not on file  Physical Activity:   . Days of Exercise per Week: Not on file  . Minutes of Exercise per Session: Not on file  Stress:   . Feeling of Stress : Not on file  Social Connections:   . Frequency of Communication with Friends and Family: Not on file  . Frequency of Social Gatherings with Friends and Family: Not on file  . Attends Religious Services: Not on file   . Active Member of Clubs or Organizations: Not on file  . Attends Archivist Meetings: Not on file  . Marital Status: Not on file  Intimate Partner Violence:   . Fear of Current or Ex-Partner: Not on file  . Emotionally Abused: Not on file  . Physically Abused: Not on file  . Sexually Abused: Not on file    FAMILY HISTORY: Family History  Problem Relation Age of Onset  . Depression Mother   . Uterine cancer Mother   . Diabetes Mother   . Heart disease Mother   . Bipolar disorder Sister   . Depression Sister   . Melanoma Sister   . Breast cancer Cousin   . Bone cancer Father   . Lung cancer Father   . Melanoma Brother   . Diabetes Brother   . Melanoma Sister   . Uterine cancer Daughter     ALLERGIES:  is allergic to penicillins and zolpidem.  MEDICATIONS:  Current Outpatient Medications  Medication Sig Dispense Refill  . acetaminophen (TYLENOL) 500 MG tablet Take 500 mg by mouth 2 (two) times daily as needed for moderate pain or headache.    Marland Kitchen atenolol (TENORMIN) 25 MG tablet Take 25 mg by mouth daily.     . busPIRone (BUSPAR) 5 MG tablet Take 5 mg by mouth 2 (two) times daily.    . DULoxetine (CYMBALTA) 60 MG capsule Take 60 mg by mouth 2 (two) times daily.     . enalapril (VASOTEC) 10 MG tablet Take 10 mg by mouth daily.     Marland Kitchen esomeprazole (NEXIUM) 40 MG capsule Take 40 mg by mouth daily.     . famotidine (PEPCID) 20 MG tablet Take 20 mg by mouth 2 (two) times daily.    . ferrous sulfate 325 (65 FE) MG tablet Take 325 mg by mouth daily.    . hydrochlorothiazide (HYDRODIURIL) 25 MG tablet Take 25 mg by mouth daily.     . metFORMIN (GLUCOPHAGE) 500 MG tablet Take 500 mg by mouth 2 (two) times daily.     . rosuvastatin (CRESTOR) 40 MG tablet Take 40 mg by mouth every evening.    . Calcium Carbonate-Vitamin D (CALTRATE 600+D PO) Take 1 tablet by mouth 2 (two) times daily.    . celecoxib (CELEBREX) 200 MG capsule Take 1 capsule (200 mg total) by mouth 2 (two)  times daily. (Patient not taking: Reported on 11/04/2019) 60 capsule 1  . ferrous gluconate (IRON 27) 240 (27 FE) MG tablet Take 240 mg by mouth at bedtime.    Marland Kitchen oxyCODONE (OXY IR/ROXICODONE) 5 MG immediate release tablet Take 1 tablet (5 mg total) by mouth every 4 (four) hours as needed for moderate pain (pain score 4-6). (Patient not taking: Reported on 11/04/2019) 30 tablet 0  . traMADol (ULTRAM) 50 MG tablet Take 1-2 tablets (50-100 mg total) by mouth every 4 (four) hours as needed for moderate pain. (Patient not taking: Reported on 11/04/2019) 30  tablet 1   No current facility-administered medications for this visit.     PHYSICAL EXAMINATION: ECOG PERFORMANCE STATUS: 1 - Symptomatic but completely ambulatory Vitals:   11/06/19 0828  BP: 133/77  Pulse: 73  Resp: 18  Temp: (!) 96.7 F (35.9 C)   Filed Weights   11/06/19 0828  Weight: 185 lb 1.6 oz (84 kg)    Physical Exam Constitutional:      General: She is not in acute distress. HENT:     Head: Normocephalic and atraumatic.  Eyes:     General: No scleral icterus.    Pupils: Pupils are equal, round, and reactive to light.  Cardiovascular:     Rate and Rhythm: Normal rate and regular rhythm.     Heart sounds: Normal heart sounds.  Pulmonary:     Effort: Pulmonary effort is normal. No respiratory distress.     Breath sounds: No wheezing.  Abdominal:     General: Bowel sounds are normal. There is no distension.     Palpations: Abdomen is soft. There is no mass.     Tenderness: There is no abdominal tenderness.  Musculoskeletal:        General: No deformity. Normal range of motion.     Cervical back: Normal range of motion and neck supple.  Skin:    General: Skin is warm and dry.     Findings: No erythema or rash.  Neurological:     Mental Status: She is alert and oriented to person, place, and time.     Cranial Nerves: No cranial nerve deficit.     Coordination: Coordination normal.  Psychiatric:        Behavior:  Behavior normal.        Thought Content: Thought content normal.     LABORATORY DATA:  I have reviewed the data as listed Lab Results  Component Value Date   WBC 9.2 11/05/2019   HGB 11.3 (L) 11/05/2019   HCT 36.4 11/05/2019   MCV 85.4 11/05/2019   PLT 394 11/05/2019   Recent Labs    06/05/19 1146 06/11/19 1030 11/05/19 1609  NA 134* 137  --   K 3.3* 4.0  --   CL 96*  --   --   CO2 28  --   --   GLUCOSE 111* 155*  --   BUN 16  --   --   CREATININE 1.20*  --   --   CALCIUM 10.0  --   --   GFRNONAA 47*  --   --   GFRAA 54*  --   --   PROT 8.3*  --  8.6*  ALBUMIN 4.4  --  4.3  AST 26  --  20  ALT 30  --  19  ALKPHOS 59  --  62  BILITOT 0.8  --  0.8  BILIDIR  --   --  <0.1  IBILI  --   --  NOT CALCULATED   Iron/TIBC/Ferritin/ %Sat No results found for: IRON, TIBC, FERRITIN, IRONPCTSAT    RADIOGRAPHIC STUDIES: I have personally reviewed the radiological images as listed and agreed with the findings in the report. No results found.   ASSESSMENT & PLAN:  1. MGUS (monoclonal gammopathy of unknown significance)   2. Normocytic anemia   3. Other fatigue    #Labs reviewed and discussed with patient. I discussed with patient about the possible diagnosis of MGUS which need further evaluation including immunofixation. I will repeat multiple myeloma panel, free light  chain ratio, urine random protein electrophoresis. Recommend to check skeletal survey.  I also discussed with patient about MGUS which is an asymptomatic condition which has a small risk of progression to malignant plasma cell dyscrasia or lymphoproliferative disorder.  Further management plan will be discussed in details after diagnosis is confirmed.  #Normocytic anemia, mild.  Check iron, TIBC, ferritin. #Fatigue, check thyroid function.  Orders Placed This Encounter  Procedures  . DG Bone Survey Met    Standing Status:   Future    Number of Occurrences:   1    Standing Expiration Date:   01/02/2021     Order Specific Question:   Reason for Exam (SYMPTOM  OR DIAGNOSIS REQUIRED)    Answer:   MGUS    Order Specific Question:   Preferred imaging location?    Answer:   [facility]  . Multiple Myeloma Panel (SPEP&IFE w/QIG)    Standing Status:   Future    Number of Occurrences:   1    Standing Expiration Date:   11/04/2020  . Kappa/lambda light chains    Standing Status:   Future    Number of Occurrences:   1    Standing Expiration Date:   11/04/2020  . CBC with Differential/Platelet    Standing Status:   Future    Number of Occurrences:   1    Standing Expiration Date:   11/04/2020  . TSH    Standing Status:   Future    Number of Occurrences:   1    Standing Expiration Date:   11/04/2020  . Hepatic function panel    Standing Status:   Future    Number of Occurrences:   1    Standing Expiration Date:   11/04/2020  . IFE and PE, Random Urine    Standing Status:   Future    Number of Occurrences:   1    Standing Expiration Date:   11/04/2020  . Beta 2 microglobulin, serum    Standing Status:   Future    Number of Occurrences:   1    Standing Expiration Date:   11/04/2020  . Basic metabolic panel    Standing Status:   Future    Standing Expiration Date:   11/04/2020    All questions were answered. The patient knows to call the clinic with any problems questions or concerns.   Holley Raring Munsoor, MD    Return of visit: 2 weeks to discuss blood work. Thank you for this kind referral and the opportunity to participate in the care of this patient. A copy of today's note is routed to referring provider    Earlie Server, MD, PhD Hematology Oncology [facility] Pager- [phone] 11/05/2019

## 2019-11-06 ENCOUNTER — Telehealth: Payer: Self-pay

## 2019-11-06 ENCOUNTER — Encounter: Payer: Self-pay | Admitting: Oncology

## 2019-11-06 LAB — MULTIPLE MYELOMA PANEL, SERUM
Albumin SerPl Elph-Mcnc: 4.2 g/dL (ref 2.9–4.4)
Albumin/Glob SerPl: 1.3 (ref 0.7–1.7)
Alpha 1: 0.2 g/dL (ref 0.0–0.4)
Alpha2 Glob SerPl Elph-Mcnc: 0.9 g/dL (ref 0.4–1.0)
B-Globulin SerPl Elph-Mcnc: 1.1 g/dL (ref 0.7–1.3)
Gamma Glob SerPl Elph-Mcnc: 1.3 g/dL (ref 0.4–1.8)
Globulin, Total: 3.5 g/dL (ref 2.2–3.9)
IgA: 180 mg/dL (ref 87–352)
IgG (Immunoglobin G), Serum: 1461 mg/dL (ref 586–1602)
IgM (Immunoglobulin M), Srm: 72 mg/dL (ref 26–217)
M Protein SerPl Elph-Mcnc: 0.7 g/dL — ABNORMAL HIGH
Total Protein ELP: 7.7 g/dL (ref 6.0–8.5)

## 2019-11-06 LAB — KAPPA/LAMBDA LIGHT CHAINS
Kappa free light chain: 49.5 mg/L — ABNORMAL HIGH (ref 3.3–19.4)
Kappa, lambda light chain ratio: 0.68 (ref 0.26–1.65)
Lambda free light chains: 72.6 mg/L — ABNORMAL HIGH (ref 5.7–26.3)

## 2019-11-06 LAB — BETA 2 MICROGLOBULIN, SERUM: Beta-2 Microglobulin: 2.9 mg/L — ABNORMAL HIGH (ref 0.6–2.4)

## 2019-11-06 NOTE — Telephone Encounter (Signed)
Per lab, there isn't enough blood to add all of them and they don't have the tubes since the test goes to the main lab, they would have to be redrawn. Did you want her to come back and get the additional labwork done?

## 2019-11-06 NOTE — Telephone Encounter (Signed)
Yes. Redraw

## 2019-11-06 NOTE — Telephone Encounter (Signed)
Done....   Pt has been scheduled for labs as requested. Pt is aware of appt details

## 2019-11-06 NOTE — Telephone Encounter (Signed)
Sue Hayes, please schedule pt for lab only, prior to MD visit ( 11/19/19). Thanks

## 2019-11-06 NOTE — Telephone Encounter (Signed)
-----   Message from Earlie Server, MD sent at 11/05/2019  9:22 PM EST ----- Please add BMP, iron tibc ferritin, folate. If b12 can not be added that's fine. Thanks.labs are ordered

## 2019-11-07 LAB — IFE AND PE, RANDOM URINE
% BETA, Urine: 21.2 %
ALPHA 1 URINE: 4.3 %
Albumin, U: 22.4 %
Alpha 2, Urine: 24.1 %
GAMMA GLOBULIN URINE: 28 %
M-SPIKE %, Urine: 14.5 % — ABNORMAL HIGH
Total Protein, Urine: 48.9 mg/dL

## 2019-11-17 ENCOUNTER — Other Ambulatory Visit: Payer: Self-pay

## 2019-11-17 ENCOUNTER — Inpatient Hospital Stay: Payer: Medicare Other | Attending: Oncology

## 2019-11-17 DIAGNOSIS — D649 Anemia, unspecified: Secondary | ICD-10-CM | POA: Diagnosis present

## 2019-11-17 DIAGNOSIS — R5383 Other fatigue: Secondary | ICD-10-CM

## 2019-11-17 DIAGNOSIS — D472 Monoclonal gammopathy: Secondary | ICD-10-CM | POA: Insufficient documentation

## 2019-11-17 LAB — BASIC METABOLIC PANEL
Anion gap: 12 (ref 5–15)
BUN: 22 mg/dL (ref 8–23)
CO2: 27 mmol/L (ref 22–32)
Calcium: 10 mg/dL (ref 8.9–10.3)
Chloride: 94 mmol/L — ABNORMAL LOW (ref 98–111)
Creatinine, Ser: 1.77 mg/dL — ABNORMAL HIGH (ref 0.44–1.00)
GFR calc Af Amer: 34 mL/min — ABNORMAL LOW (ref >60)
GFR calc non Af Amer: 29 mL/min — ABNORMAL LOW (ref >60)
Glucose, Bld: 174 mg/dL — ABNORMAL HIGH (ref 70–99)
Potassium: 3.5 mmol/L (ref 3.5–5.1)
Sodium: 133 mmol/L — ABNORMAL LOW (ref 135–145)

## 2019-11-17 LAB — FOLATE: Folate: 19.8 ng/mL (ref >5.9)

## 2019-11-17 LAB — VITAMIN B12: Vitamin B-12: 326 pg/mL (ref 180–914)

## 2019-11-17 LAB — IRON AND TIBC
Iron: 71 ug/dL (ref 28–170)
Saturation Ratios: 18 % (ref 10.4–31.8)
TIBC: 385 ug/dL (ref 250–450)
UIBC: 314 ug/dL

## 2019-11-17 LAB — FERRITIN: Ferritin: 63 ng/mL (ref 11–307)

## 2019-11-19 ENCOUNTER — Inpatient Hospital Stay (HOSPITAL_BASED_OUTPATIENT_CLINIC_OR_DEPARTMENT_OTHER): Payer: Medicare Other | Admitting: Oncology

## 2019-11-19 ENCOUNTER — Other Ambulatory Visit: Payer: Self-pay | Admitting: Oncology

## 2019-11-19 ENCOUNTER — Encounter: Payer: Self-pay | Admitting: Oncology

## 2019-11-19 DIAGNOSIS — D472 Monoclonal gammopathy: Secondary | ICD-10-CM

## 2019-11-19 DIAGNOSIS — D649 Anemia, unspecified: Secondary | ICD-10-CM

## 2019-11-19 NOTE — Progress Notes (Signed)
Patient reports her appetite is not as good as usual and not able to obtain a weight at home.  She is having new right low back pain that happens about every 3-4 days and will last all day with last episode 2 days ago.  When she has the pain it will be 8/10 on pain scale.  Also would like to let Dr. Tasia Catchings know that her dizziness is getting worse.

## 2019-11-19 NOTE — Progress Notes (Addendum)
HEMATOLOGY-ONCOLOGY TeleHEALTH VISIT PROGRESS NOTE  I connected with Sue Hayes on 11/19/19 at  2:30 PM EST by video enabled telemedicine visit and verified that I am speaking with the correct person using two identifiers. I discussed the limitations, risks, security and privacy concerns of performing an evaluation and management service by telemedicine and the availability of in-person appointments. I also discussed with the patient that there may be a patient responsible charge related to this service. The patient expressed understanding and agreed to proceed.   Other persons participating in the visit and their role in the encounter:  Daughter who helps her to set up virtual visit. Patient's location: Home  Provider's location: office Chief Complaint: Follow-up for MGUS   INTERVAL HISTORY Sue Hayes is a 69 y.o. female who has above history reviewed by me today presents for follow up visit for management of MGUS Problems and complaints are listed below:  Patient presented virtually to discuss blood work-up results. She reports no new complaints.  Review of Systems  Constitutional: Negative for appetite change, chills, fatigue and fever.  HENT:   Negative for hearing loss and voice change.   Eyes: Negative for eye problems.  Respiratory: Negative for chest tightness and cough.   Cardiovascular: Negative for chest pain.  Gastrointestinal: Negative for abdominal distention, abdominal pain and blood in stool.  Endocrine: Negative for hot flashes.  Genitourinary: Negative for difficulty urinating and frequency.   Musculoskeletal: Negative for arthralgias.       Right hip pain  Skin: Negative for itching and rash.  Neurological: Negative for extremity weakness.  Hematological: Negative for adenopathy.  Psychiatric/Behavioral: Negative for confusion.    Past Medical History:  Diagnosis Date  . Acid reflux disease   . Ankle fracture, right   . Arthritis   . Cancer (HCC)     cervical cancer  . Fracture of capitate bone of wrist   . HTN (hypertension)   . Panic disorder with agoraphobia and moderate panic attacks   . Recurrent major depression-severe ([place])   . Sleep apnea    CPAP/ dosen't use regularly  . Type II diabetes mellitus ([place])    Past Surgical History:  Procedure Laterality Date  . CHOLECYSTECTOMY    . KNEE ARTHROPLASTY Right 06/11/2019   Procedure: COMPUTER ASSISTED TOTAL KNEE ARTHROPLASTY;  Surgeon: Dereck Leep, MD;  Location: [facility] ORS;  Service: Orthopedics;  Laterality: Right;  . KNEE ARTHROSCOPY Bilateral   . TUBAL LIGATION      Family History  Problem Relation Age of Onset  . Depression Mother   . Uterine cancer Mother   . Diabetes Mother   . Heart disease Mother   . Bipolar disorder Sister   . Depression Sister   . Melanoma Sister   . Breast cancer Cousin   . Bone cancer Father   . Lung cancer Father   . Melanoma Brother   . Diabetes Brother   . Melanoma Sister   . Uterine cancer Daughter     Social History   Socioeconomic History  . Marital status: Divorced    Spouse name: Not on file  . Number of children: Not on file  . Years of education: Not on file  . Highest education level: Not on file  Occupational History  . Not on file  Tobacco Use  . Smoking status: Never Smoker  . Smokeless tobacco: Never Used  Substance and Sexual Activity  . Alcohol use: Not Currently    Alcohol/week: 5.0 standard drinks  Types: 5 Glasses of wine per week  . Drug use: Never  . Sexual activity: Not on file  Other Topics Concern  . Not on file  Social History Narrative  . Not on file   Social Determinants of Health   Financial Resource Strain:   . Difficulty of Paying Living Expenses: Not on file  Food Insecurity:   . Worried About Charity fundraiser in the Last Year: Not on file  . Ran Out of Food in the Last Year: Not on file  Transportation Needs:   . Lack of Transportation (Medical): Not on file  . Lack of  Transportation (Non-Medical): Not on file  Physical Activity:   . Days of Exercise per Week: Not on file  . Minutes of Exercise per Session: Not on file  Stress:   . Feeling of Stress : Not on file  Social Connections:   . Frequency of Communication with Friends and Family: Not on file  . Frequency of Social Gatherings with Friends and Family: Not on file  . Attends Religious Services: Not on file  . Active Member of Clubs or Organizations: Not on file  . Attends Archivist Meetings: Not on file  . Marital Status: Not on file  Intimate Partner Violence:   . Fear of Current or Ex-Partner: Not on file  . Emotionally Abused: Not on file  . Physically Abused: Not on file  . Sexually Abused: Not on file    Current Outpatient Medications on File Prior to Visit  Medication Sig Dispense Refill  . acetaminophen (TYLENOL) 500 MG tablet Take 500 mg by mouth 2 (two) times daily as needed for moderate pain or headache.    Marland Kitchen atenolol (TENORMIN) 25 MG tablet Take 25 mg by mouth daily.     . busPIRone (BUSPAR) 5 MG tablet Take 5 mg by mouth 2 (two) times daily.    . DULoxetine (CYMBALTA) 60 MG capsule Take 60 mg by mouth 2 (two) times daily.     . enalapril (VASOTEC) 10 MG tablet Take 10 mg by mouth daily.     Marland Kitchen esomeprazole (NEXIUM) 40 MG capsule Take 40 mg by mouth daily.     . famotidine (PEPCID) 20 MG tablet Take 20 mg by mouth 2 (two) times daily.    . ferrous sulfate 325 (65 FE) MG tablet Take 325 mg by mouth daily.    . hydrochlorothiazide (HYDRODIURIL) 25 MG tablet Take 25 mg by mouth daily.     . metFORMIN (GLUCOPHAGE) 500 MG tablet Take 500 mg by mouth 2 (two) times daily.     . rosuvastatin (CRESTOR) 40 MG tablet Take 40 mg by mouth every evening.     No current facility-administered medications on file prior to visit.    Allergies  Allergen Reactions  . Penicillins Shortness Of Breath    Did it involve swelling of the face/tongue/throat, SOB, or low BP? Yes Did it involve  sudden or severe rash/hives, skin peeling, or any reaction on the inside of your mouth or nose? No Did you need to seek medical attention at a hospital or doctor's office? Yes When did it last happen?40 + years If all above answers are "NO", may proceed with cephalosporin use.   Marland Kitchen Zolpidem Other (See Comments)    Sleep walking        Observations/Objective: Today's Vitals   11/19/19 1401  PainSc: 0-No pain   There is no height or weight on file to calculate BMI.  Physical Exam  Constitutional: No distress.  Neurological: She is alert.    CBC    Component Value Date/Time   WBC 9.2 11/05/2019 1609   RBC 4.26 11/05/2019 1609   HGB 11.3 (L) 11/05/2019 1609   HCT 36.4 11/05/2019 1609   PLT 394 11/05/2019 1609   MCV 85.4 11/05/2019 1609   MCH 26.5 11/05/2019 1609   MCHC 31.0 11/05/2019 1609   RDW 15.7 (H) 11/05/2019 1609   LYMPHSABS 2.6 11/05/2019 1609   MONOABS 0.7 11/05/2019 1609   EOSABS 0.1 11/05/2019 1609   BASOSABS 0.1 11/05/2019 1609    CMP     Component Value Date/Time   NA 133 (L) 11/17/2019 1121   K 3.5 11/17/2019 1121   CL 94 (L) 11/17/2019 1121   CO2 27 11/17/2019 1121   GLUCOSE 174 (H) 11/17/2019 1121   BUN 22 11/17/2019 1121   CREATININE 1.77 (H) 11/17/2019 1121   CALCIUM 10.0 11/17/2019 1121   PROT 8.6 (H) 11/05/2019 1609   ALBUMIN 4.3 11/05/2019 1609   AST 20 11/05/2019 1609   ALT 19 11/05/2019 1609   ALKPHOS 62 11/05/2019 1609   BILITOT 0.8 11/05/2019 1609   GFRNONAA 29 (L) 11/17/2019 1121   GFRAA 34 (L) 11/17/2019 1121    RADIOGRAPHIC STUDIES: I have personally reviewed the radiological images as listed and agreed with the findings in the report. DG Bone Survey Met  Result Date: 11/05/2019 CLINICAL DATA:  Metastatic bone survey. EXAM: METASTATIC BONE SURVEY COMPARISON:  Chest x-ray dated March 20, 2018 FINDINGS: A frontal view of the chest demonstrate atherosclerotic changes of the thoracic aorta. The heart size is normal. There is no  focal infiltrate. No large pleural effusion. There is no acute osseous abnormality. A lateral view of the cervical spine demonstrates degenerative changes without evidence for an acute osseous abnormality. There is no lytic or blastic lesion. A lateral view of the skull demonstrates no lytic or blastic lesions. A frontal view of the cervical spine demonstrates degenerative changes without evidence for lytic or blastic lesion. Frontal views of both shoulders demonstrate no lytic or blastic lesions. Frontal views of both humeri demonstrate no lytic or blastic lesions. Views of the bilateral forearms demonstrate no lytic or blastic lesions. A frontal view of the thoracolumbar spine demonstrates degenerative changes without evidence for a lytic or blastic lesion. Phleboliths project over the patient's pelvis. A frontal view of the pelvis does not demonstrate a lytic or blastic lesion. There is is a subtle sclerotic area in the left superior acetabulum which likely represents a small bone island. Views of both femurs demonstrate no lytic or blastic lesions. The patient is status post total knee arthroplasty on the right. There are end-stage degenerative changes of the left knee. A frontal view of the right tibia and fibula demonstrate a few subtle lucent lesions in the proximal tibia. There are no lytic lesions involving the left tibia or fibula. A lateral view of the thoracic spine demonstrates no significant osseous abnormality. A lateral view of the lumbar spine demonstrates no acute osseous abnormality. Multilevel degenerative changes are noted. 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 There is severe disc height loss at the L5-S1 level. IMPRESSION: 1. There are a few subtle lucent lesions in the proximal right tibia. No other lytic or blastic lesions are seen. 2. Multilevel degenerative changes of the lumbar spine. 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 3. End-stage degenerative changes of the left hip. Electronically Signed   By: Constance Holster M.D.   On: 11/05/2019 22:52     Assessment and Plan: 1. MGUS (monoclonal gammopathy of unknown significance)   2. Normocytic anemia     #IgG lambda MGUS Labs reviewed and discussed with patient. Patient has IgG MGUS. I discussed with patient about the diagnosis of IgG lambda MGUS which is an asymptomatic condition which has a small risk of progression to malignant plasma cell dyscrasia or lymphoproliferative disorder.  For now I recommend observation. Check SPEP and light chain ratio every 4-6 months.  Patient has right tibia lucencies on x-rays.  I will repeat x-ray of tibia in 4 months. Lamda MGUS, need to watch for light chain amyloidosis.  I will check NT-proBNP at next visit.  Recent urine protein 48.9/ml. no- nephrotic range proteinuria.   #Normocytic anemia, hemoglobin is above 10.  Likely secondary to chronic kidney disease. Acceptable iron level, folate and vitamin B12 level.  Continue to monitor.  Follow Up Instructions: 4 months   I discussed the assessment and treatment plan with the patient. The patient was provided an opportunity to ask questions and all were answered. The patient agreed with the plan and demonstrated an understanding of the instructions.  The patient was advised to call back or seek an in-person evaluation if the symptoms worsen or if the condition fails to improve as anticipated.     Earlie Server, MD 11/19/2019 10:23 PM

## 2019-11-20 ENCOUNTER — Ambulatory Visit
Admission: RE | Admit: 2019-11-20 | Discharge: 2019-11-20 | Disposition: A | Discharge status: Home / Self Care | Payer: Medicare Other | Source: Ambulatory Visit | Attending: Oncology | Admitting: Oncology

## 2019-11-20 ENCOUNTER — Encounter: Payer: Self-pay | Admitting: Oncology

## 2019-11-24 ENCOUNTER — Other Ambulatory Visit: Payer: Self-pay | Admitting: Family Medicine

## 2019-11-24 DIAGNOSIS — Z1231 Encounter for screening mammogram for malignant neoplasm of breast: Secondary | ICD-10-CM

## 2019-12-29 ENCOUNTER — Ambulatory Visit
Admission: RE | Admit: 2019-12-29 | Discharge: 2019-12-29 | Disposition: A | Discharge status: Home / Self Care | Payer: Medicare Other | Source: Ambulatory Visit | Attending: Nephrology | Admitting: Nephrology

## 2019-12-29 ENCOUNTER — Other Ambulatory Visit: Payer: Self-pay

## 2019-12-29 DIAGNOSIS — E1121 Type 2 diabetes mellitus with diabetic nephropathy: Secondary | ICD-10-CM | POA: Diagnosis not present

## 2019-12-29 DIAGNOSIS — E1122 Type 2 diabetes mellitus with diabetic chronic kidney disease: Secondary | ICD-10-CM

## 2019-12-29 DIAGNOSIS — N1831 Chronic kidney disease, stage 3a: Secondary | ICD-10-CM | POA: Insufficient documentation

## 2020-01-07 ENCOUNTER — Other Ambulatory Visit: Payer: Self-pay | Admitting: Nephrology

## 2020-01-07 DIAGNOSIS — N1832 Chronic kidney disease, stage 3b: Secondary | ICD-10-CM

## 2020-01-07 DIAGNOSIS — N2889 Other specified disorders of kidney and ureter: Secondary | ICD-10-CM

## 2020-01-11 ENCOUNTER — Ambulatory Visit
Admission: RE | Admit: 2020-01-11 | Discharge: 2020-01-11 | Disposition: A | Discharge status: Home / Self Care | Payer: Medicare Other | Source: Ambulatory Visit | Attending: Nephrology | Admitting: Nephrology

## 2020-01-11 ENCOUNTER — Other Ambulatory Visit: Payer: Self-pay

## 2020-01-11 DIAGNOSIS — N1832 Chronic kidney disease, stage 3b: Secondary | ICD-10-CM | POA: Insufficient documentation

## 2020-01-11 DIAGNOSIS — N2889 Other specified disorders of kidney and ureter: Secondary | ICD-10-CM | POA: Insufficient documentation

## 2020-02-03 ENCOUNTER — Ambulatory Visit
Admission: RE | Admit: 2020-02-03 | Discharge: 2020-02-03 | Disposition: A | Discharge status: Home / Self Care | Payer: Medicare Other | Source: Ambulatory Visit | Attending: Family Medicine | Admitting: Family Medicine

## 2020-02-03 DIAGNOSIS — Z780 Asymptomatic menopausal state: Secondary | ICD-10-CM | POA: Insufficient documentation

## 2020-02-03 DIAGNOSIS — Z1231 Encounter for screening mammogram for malignant neoplasm of breast: Secondary | ICD-10-CM | POA: Diagnosis present

## 2020-03-19 ENCOUNTER — Other Ambulatory Visit: Payer: Self-pay

## 2020-03-19 DIAGNOSIS — D472 Monoclonal gammopathy: Secondary | ICD-10-CM

## 2020-03-22 ENCOUNTER — Ambulatory Visit
Admission: RE | Admit: 2020-03-22 | Discharge: 2020-03-22 | Disposition: A | Discharge status: Home / Self Care | Payer: Medicare Other | Source: Ambulatory Visit | Attending: Oncology | Admitting: Oncology

## 2020-03-22 ENCOUNTER — Other Ambulatory Visit: Payer: Self-pay

## 2020-03-22 ENCOUNTER — Ambulatory Visit
Admission: RE | Admit: 2020-03-22 | Discharge: 2020-03-22 | Disposition: A | Discharge status: Home / Self Care | Payer: Medicare Other | Attending: Oncology | Admitting: Oncology

## 2020-03-22 ENCOUNTER — Inpatient Hospital Stay: Payer: Medicare Other | Attending: Oncology

## 2020-03-22 DIAGNOSIS — D472 Monoclonal gammopathy: Secondary | ICD-10-CM

## 2020-03-22 LAB — COMPREHENSIVE METABOLIC PANEL
ALT: 20 U/L (ref 0–44)
AST: 23 U/L (ref 15–41)
Albumin: 4 g/dL (ref 3.5–5.0)
Alkaline Phosphatase: 59 U/L (ref 38–126)
Anion gap: 10 (ref 5–15)
BUN: 20 mg/dL (ref 8–23)
CO2: 28 mmol/L (ref 22–32)
Calcium: 9.2 mg/dL (ref 8.9–10.3)
Chloride: 100 mmol/L (ref 98–111)
Creatinine, Ser: 1.25 mg/dL — ABNORMAL HIGH (ref 0.44–1.00)
GFR calc Af Amer: 51 mL/min — ABNORMAL LOW (ref >60)
GFR calc non Af Amer: 44 mL/min — ABNORMAL LOW (ref >60)
Glucose, Bld: 130 mg/dL — ABNORMAL HIGH (ref 70–99)
Potassium: 3.9 mmol/L (ref 3.5–5.1)
Sodium: 138 mmol/L (ref 135–145)
Total Bilirubin: 0.6 mg/dL (ref 0.3–1.2)
Total Protein: 7.7 g/dL (ref 6.5–8.1)

## 2020-03-22 LAB — CBC WITH DIFFERENTIAL/PLATELET
Abs Immature Granulocytes: 0.04 10*3/uL (ref 0.00–0.07)
Basophils Absolute: 0.1 10*3/uL (ref 0.0–0.1)
Basophils Relative: 1 %
Eosinophils Absolute: 0.1 10*3/uL (ref 0.0–0.5)
Eosinophils Relative: 1 %
HCT: 34.2 % — ABNORMAL LOW (ref 36.0–46.0)
Hemoglobin: 11.4 g/dL — ABNORMAL LOW (ref 12.0–15.0)
Immature Granulocytes: 1 %
Lymphocytes Relative: 23 %
Lymphs Abs: 1.6 10*3/uL (ref 0.7–4.0)
MCH: 28.6 pg (ref 26.0–34.0)
MCHC: 33.3 g/dL (ref 30.0–36.0)
MCV: 85.7 fL (ref 80.0–100.0)
Monocytes Absolute: 0.6 10*3/uL (ref 0.1–1.0)
Monocytes Relative: 9 %
Neutro Abs: 4.6 10*3/uL (ref 1.7–7.7)
Neutrophils Relative %: 65 %
Platelets: 281 10*3/uL (ref 150–400)
RBC: 3.99 MIL/uL (ref 3.87–5.11)
RDW: 14.6 % (ref 11.5–15.5)
WBC: 6.9 10*3/uL (ref 4.0–10.5)
nRBC: 0 % (ref 0.0–0.2)

## 2020-03-23 LAB — MISC LABCORP TEST (SEND OUT): Labcorp test code: 143000

## 2020-03-23 LAB — KAPPA/LAMBDA LIGHT CHAINS
Kappa free light chain: 33.3 mg/L — ABNORMAL HIGH (ref 3.3–19.4)
Kappa, lambda light chain ratio: 0.48 (ref 0.26–1.65)
Lambda free light chains: 69.3 mg/L — ABNORMAL HIGH (ref 5.7–26.3)

## 2020-03-24 LAB — MULTIPLE MYELOMA PANEL, SERUM
Albumin SerPl Elph-Mcnc: 3.4 g/dL (ref 2.9–4.4)
Albumin/Glob SerPl: 1 (ref 0.7–1.7)
Alpha 1: 0.3 g/dL (ref 0.0–0.4)
Alpha2 Glob SerPl Elph-Mcnc: 1 g/dL (ref 0.4–1.0)
B-Globulin SerPl Elph-Mcnc: 1.1 g/dL (ref 0.7–1.3)
Gamma Glob SerPl Elph-Mcnc: 1.3 g/dL (ref 0.4–1.8)
Globulin, Total: 3.6 g/dL (ref 2.2–3.9)
IgA: 160 mg/dL (ref 87–352)
IgG (Immunoglobin G), Serum: 1275 mg/dL (ref 586–1602)
IgM (Immunoglobulin M), Srm: 53 mg/dL (ref 26–217)
M Protein SerPl Elph-Mcnc: 0.7 g/dL — ABNORMAL HIGH
Total Protein ELP: 7 g/dL (ref 6.0–8.5)

## 2020-03-29 ENCOUNTER — Encounter: Payer: Self-pay | Admitting: Oncology

## 2020-03-29 ENCOUNTER — Inpatient Hospital Stay (HOSPITAL_BASED_OUTPATIENT_CLINIC_OR_DEPARTMENT_OTHER): Payer: Medicare Other | Admitting: Oncology

## 2020-03-29 DIAGNOSIS — D472 Monoclonal gammopathy: Secondary | ICD-10-CM | POA: Diagnosis not present

## 2020-03-29 DIAGNOSIS — D649 Anemia, unspecified: Secondary | ICD-10-CM

## 2020-03-29 NOTE — Progress Notes (Signed)
HEMATOLOGY-ONCOLOGY TeleHEALTH VISIT PROGRESS NOTE  I connected with Sue Hayes on 03/29/20 at  2:15 PM EDT by video enabled telemedicine visit and verified that I am speaking with the correct person using two identifiers. I discussed the limitations, risks, security and privacy concerns of performing an evaluation and management service by telemedicine and the availability of in-person appointments. I also discussed with the patient that there may be a patient responsible charge related to this service. The patient expressed understanding and agreed to proceed.   Other persons participating in the visit and their role in the encounter:  None  Patient's location: Home  Provider's location: office Chief Complaint: follow up for Sue Hayes is a 69 y.o. female who has above history reviewed by me today presents for follow up visit for management of MGUS Problems and complaints are listed below:  She reports feeling well. No new complaints.  Review of Systems  Constitutional: Negative for appetite change, chills, fatigue and fever.  HENT:   Negative for hearing loss and voice change.   Eyes: Negative for eye problems.  Respiratory: Negative for chest tightness and cough.   Cardiovascular: Negative for chest pain.  Gastrointestinal: Negative for abdominal distention, abdominal pain and blood in stool.  Endocrine: Negative for hot flashes.  Genitourinary: Negative for difficulty urinating and frequency.   Musculoskeletal: Negative for arthralgias.  Skin: Negative for itching and rash.  Neurological: Negative for extremity weakness.  Hematological: Negative for adenopathy.  Psychiatric/Behavioral: Negative for confusion.    Past Medical History:  Diagnosis Date   Acid reflux disease    Ankle fracture, right    Arthritis    Cancer ([place])    cervical cancer   Fracture of capitate bone of wrist    HTN (hypertension)    Panic disorder with agoraphobia  and moderate panic attacks    Recurrent major depression-severe ([facility])    Sleep apnea    CPAP/ dosen't use regularly   Type II diabetes mellitus ([place])    Past Surgical History:  Procedure Laterality Date   CHOLECYSTECTOMY     KNEE ARTHROPLASTY Right 06/11/2019   Procedure: COMPUTER ASSISTED TOTAL KNEE ARTHROPLASTY;  Surgeon: Dereck Leep, MD;  Location: [facility] ORS;  Service: Orthopedics;  Laterality: Right;   KNEE ARTHROSCOPY Bilateral    TUBAL LIGATION      Family History  Problem Relation Age of Onset   Depression Mother    Uterine cancer Mother    Diabetes Mother    Heart disease Mother    Bipolar disorder Sister    Depression Sister    Melanoma Sister    Breast cancer Cousin    Bone cancer Father    Lung cancer Father    Melanoma Brother    Diabetes Brother    Melanoma Sister    Uterine cancer Daughter     Social History   Socioeconomic History   Marital status: Divorced    Spouse name: Not on file   Number of children: Not on file   Years of education: Not on file   Highest education level: Not on file  Occupational History   Not on file  Tobacco Use   Smoking status: Never Smoker   Smokeless tobacco: Never Used  Vaping Use   Vaping Use: Never used  Substance and Sexual Activity   Alcohol use: Not Currently    Alcohol/week: 5.0 standard drinks    Types: 5 Glasses of wine per week  Drug use: Never   Sexual activity: Not on file  Other Topics Concern   Not on file  Social History Narrative   Not on file   Social Determinants of Health   Financial Resource Strain:    Difficulty of Paying Living Expenses:   Food Insecurity:    Worried About [place] in the Last Year:    Arboriculturist in the Last Year:   Transportation Needs:    Film/video editor (Medical):    Lack of Transportation (Non-Medical):   Physical Activity:    Days of Exercise per Week:    Minutes of Exercise per Session:    Stress:    Feeling of Stress :   Social Connections:    Frequency of Communication with Friends and Family:    Frequency of Social Gatherings with Friends and Family:    Attends Religious Services:    Active Member of Clubs or Organizations:    Attends Music therapist:    Marital Status:   Intimate Partner Violence:    Fear of Current or Ex-Partner:    Emotionally Abused:    Physically Abused:    Sexually Abused:     Current Outpatient Medications on File Prior to Visit  Medication Sig Dispense Refill   acetaminophen (TYLENOL) 500 MG tablet Take 500 mg by mouth 2 (two) times daily as needed for moderate pain or headache.     atenolol (TENORMIN) 25 MG tablet Take 25 mg by mouth daily.      busPIRone (BUSPAR) 5 MG tablet Take 5 mg by mouth 2 (two) times daily.     DULoxetine (CYMBALTA) 60 MG capsule Take 60 mg by mouth 2 (two) times daily.      enalapril (VASOTEC) 10 MG tablet Take 10 mg by mouth daily.      esomeprazole (NEXIUM) 40 MG capsule Take 40 mg by mouth daily.      ferrous sulfate 325 (65 FE) MG tablet Take 325 mg by mouth daily.     hydrochlorothiazide (HYDRODIURIL) 25 MG tablet Take 25 mg by mouth daily.      metFORMIN (GLUCOPHAGE) 500 MG tablet Take 500 mg by mouth 2 (two) times daily.      rosuvastatin (CRESTOR) 40 MG tablet Take 40 mg by mouth every evening.     No current facility-administered medications on file prior to visit.    Allergies  Allergen Reactions   Penicillins Shortness Of Breath    Did it involve swelling of the face/tongue/throat, SOB, or low BP? Yes Did it involve sudden or severe rash/hives, skin peeling, or any reaction on the inside of your mouth or nose? No Did you need to seek medical attention at a hospital or doctor's office? Yes When did it last happen?40 + years If all above answers are "NO", may proceed with cephalosporin use.    Zolpidem Other (See Comments)    Sleep walking         Observations/Objective: Today's Vitals   03/29/20 1357  PainSc: 0-No pain   There is no height or weight on file to calculate BMI.  Physical Exam Neurological:     Mental Status: She is alert.     CBC    Component Value Date/Time   WBC 6.9 03/22/2020 1302   RBC 3.99 03/22/2020 1302   HGB 11.4 (L) 03/22/2020 1302   HCT 34.2 (L) 03/22/2020 1302   PLT 281 03/22/2020 1302   MCV 85.7 03/22/2020 1302  MCH 28.6 03/22/2020 1302   MCHC 33.3 03/22/2020 1302   RDW 14.6 03/22/2020 1302   LYMPHSABS 1.6 03/22/2020 1302   MONOABS 0.6 03/22/2020 1302   EOSABS 0.1 03/22/2020 1302   BASOSABS 0.1 03/22/2020 1302    CMP     Component Value Date/Time   NA 138 03/22/2020 1302   K 3.9 03/22/2020 1302   CL 100 03/22/2020 1302   CO2 28 03/22/2020 1302   GLUCOSE 130 (H) 03/22/2020 1302   BUN 20 03/22/2020 1302   CREATININE 1.25 (H) 03/22/2020 1302   CALCIUM 9.2 03/22/2020 1302   PROT 7.7 03/22/2020 1302   ALBUMIN 4.0 03/22/2020 1302   AST 23 03/22/2020 1302   ALT 20 03/22/2020 1302   ALKPHOS 59 03/22/2020 1302   BILITOT 0.6 03/22/2020 1302   GFRNONAA 44 (L) 03/22/2020 1302   GFRAA 51 (L) 03/22/2020 1302     Assessment and Plan: 1. MGUS (monoclonal gammopathy of unknown significance)   2. Normocytic anemia     Labs are reviewed and discussed with patient. Multiple myeloma panel showed stable M protein of 0.7, normal light chain ratio.  NTproBNP is normal.  Recommend continue observation.   Normocytic anemia, stable. Hemoglobin is 11.4. monitor. Likely due to CKD.   Follow Up Instructions: Follow up in 6 months.    I discussed the assessment and treatment plan with the patient. The patient was provided an opportunity to ask questions and all were answered. The patient agreed with the plan and demonstrated an understanding of the instructions.  The patient was advised to call back or seek an in-person evaluation if the symptoms worsen or if the condition fails to improve as  anticipated.     Earlie Server, MD 03/29/2020 9:56 PM

## 2020-03-29 NOTE — Progress Notes (Signed)
Patient contacted for Mychart Visit. No new concerns voiced.

## 2020-09-17 ENCOUNTER — Inpatient Hospital Stay: Payer: Medicare Other | Attending: Oncology

## 2020-09-17 DIAGNOSIS — E1122 Type 2 diabetes mellitus with diabetic chronic kidney disease: Secondary | ICD-10-CM | POA: Insufficient documentation

## 2020-09-17 DIAGNOSIS — D472 Monoclonal gammopathy: Secondary | ICD-10-CM | POA: Insufficient documentation

## 2020-09-17 DIAGNOSIS — I129 Hypertensive chronic kidney disease with stage 1 through stage 4 chronic kidney disease, or unspecified chronic kidney disease: Secondary | ICD-10-CM | POA: Insufficient documentation

## 2020-09-17 DIAGNOSIS — N189 Chronic kidney disease, unspecified: Secondary | ICD-10-CM | POA: Insufficient documentation

## 2020-09-17 DIAGNOSIS — D649 Anemia, unspecified: Secondary | ICD-10-CM | POA: Insufficient documentation

## 2020-09-20 ENCOUNTER — Other Ambulatory Visit: Payer: Self-pay

## 2020-09-20 ENCOUNTER — Inpatient Hospital Stay: Payer: Medicare Other

## 2020-09-20 DIAGNOSIS — D472 Monoclonal gammopathy: Secondary | ICD-10-CM

## 2020-09-20 DIAGNOSIS — N189 Chronic kidney disease, unspecified: Secondary | ICD-10-CM | POA: Diagnosis not present

## 2020-09-20 DIAGNOSIS — D649 Anemia, unspecified: Secondary | ICD-10-CM | POA: Diagnosis not present

## 2020-09-20 DIAGNOSIS — E1122 Type 2 diabetes mellitus with diabetic chronic kidney disease: Secondary | ICD-10-CM | POA: Diagnosis not present

## 2020-09-20 DIAGNOSIS — I129 Hypertensive chronic kidney disease with stage 1 through stage 4 chronic kidney disease, or unspecified chronic kidney disease: Secondary | ICD-10-CM | POA: Diagnosis not present

## 2020-09-20 LAB — COMPREHENSIVE METABOLIC PANEL
ALT: 32 U/L (ref 0–44)
AST: 30 U/L (ref 15–41)
Albumin: 4.4 g/dL (ref 3.5–5.0)
Alkaline Phosphatase: 53 U/L (ref 38–126)
Anion gap: 14 (ref 5–15)
BUN: 26 mg/dL — ABNORMAL HIGH (ref 8–23)
CO2: 25 mmol/L (ref 22–32)
Calcium: 9.5 mg/dL (ref 8.9–10.3)
Chloride: 98 mmol/L (ref 98–111)
Creatinine, Ser: 1.26 mg/dL — ABNORMAL HIGH (ref 0.44–1.00)
GFR, Estimated: 46 mL/min — ABNORMAL LOW (ref >60)
Glucose, Bld: 126 mg/dL — ABNORMAL HIGH (ref 70–99)
Potassium: 3.4 mmol/L — ABNORMAL LOW (ref 3.5–5.1)
Sodium: 137 mmol/L (ref 135–145)
Total Bilirubin: 0.7 mg/dL (ref 0.3–1.2)
Total Protein: 7.8 g/dL (ref 6.5–8.1)

## 2020-09-20 LAB — CBC WITH DIFFERENTIAL/PLATELET
Abs Immature Granulocytes: 0.02 10*3/uL (ref 0.00–0.07)
Basophils Absolute: 0.1 10*3/uL (ref 0.0–0.1)
Basophils Relative: 1 %
Eosinophils Absolute: 0.1 10*3/uL (ref 0.0–0.5)
Eosinophils Relative: 1 %
HCT: 34.8 % — ABNORMAL LOW (ref 36.0–46.0)
Hemoglobin: 11.4 g/dL — ABNORMAL LOW (ref 12.0–15.0)
Immature Granulocytes: 0 %
Lymphocytes Relative: 28 %
Lymphs Abs: 2.2 10*3/uL (ref 0.7–4.0)
MCH: 29.6 pg (ref 26.0–34.0)
MCHC: 32.8 g/dL (ref 30.0–36.0)
MCV: 90.4 fL (ref 80.0–100.0)
Monocytes Absolute: 0.7 10*3/uL (ref 0.1–1.0)
Monocytes Relative: 8 %
Neutro Abs: 4.9 10*3/uL (ref 1.7–7.7)
Neutrophils Relative %: 62 %
Platelets: 274 10*3/uL (ref 150–400)
RBC: 3.85 MIL/uL — ABNORMAL LOW (ref 3.87–5.11)
RDW: 13.3 % (ref 11.5–15.5)
WBC: 7.9 10*3/uL (ref 4.0–10.5)
nRBC: 0 % (ref 0.0–0.2)

## 2020-09-21 LAB — MULTIPLE MYELOMA PANEL, SERUM
Albumin SerPl Elph-Mcnc: 3.9 g/dL (ref 2.9–4.4)
Albumin/Glob SerPl: 1.2 (ref 0.7–1.7)
Alpha 1: 0.3 g/dL (ref 0.0–0.4)
Alpha2 Glob SerPl Elph-Mcnc: 0.9 g/dL (ref 0.4–1.0)
B-Globulin SerPl Elph-Mcnc: 1.1 g/dL (ref 0.7–1.3)
Gamma Glob SerPl Elph-Mcnc: 1.2 g/dL (ref 0.4–1.8)
Globulin, Total: 3.4 g/dL (ref 2.2–3.9)
IgA: 159 mg/dL (ref 87–352)
IgG (Immunoglobin G), Serum: 1348 mg/dL (ref 586–1602)
IgM (Immunoglobulin M), Srm: 63 mg/dL (ref 26–217)
M Protein SerPl Elph-Mcnc: 0.6 g/dL — ABNORMAL HIGH
Total Protein ELP: 7.3 g/dL (ref 6.0–8.5)

## 2020-09-21 LAB — KAPPA/LAMBDA LIGHT CHAINS
Kappa free light chain: 37.4 mg/L — ABNORMAL HIGH (ref 3.3–19.4)
Kappa, lambda light chain ratio: 0.64 (ref 0.26–1.65)
Lambda free light chains: 58.5 mg/L — ABNORMAL HIGH (ref 5.7–26.3)

## 2020-09-24 ENCOUNTER — Inpatient Hospital Stay: Payer: Medicare Other | Admitting: Oncology

## 2020-09-24 ENCOUNTER — Encounter: Payer: Self-pay | Admitting: Oncology

## 2020-09-24 ENCOUNTER — Other Ambulatory Visit: Payer: Self-pay

## 2020-09-24 VITALS — BP 135/66 | HR 80 | Temp 97.8°F | Resp 16 | Wt 193.8 lb

## 2020-09-24 DIAGNOSIS — D472 Monoclonal gammopathy: Secondary | ICD-10-CM

## 2020-09-24 DIAGNOSIS — D649 Anemia, unspecified: Secondary | ICD-10-CM

## 2020-09-24 NOTE — Progress Notes (Signed)
Hematology/Oncology progress note [facility] Telephone:([phone] Fax:([phone]   Patient Care Team: Gayland Curry, MD as PCP - General (Family Medicine)  REFERRING PROVIDER: Gayland Curry, MD  CHIEF COMPLAINTS/REASON FOR VISIT:  Follow-up for IgG lambda MGUS  HISTORY OF PRESENTING ILLNESS:   Sue Hayes is a  69 y.o.  female with PMH listed below was seen in consultation at the request of  Gayland Curry, MD  for evaluation of abnormal protein electrophoresis. Patient follows up with Dr. Holley Raring for chronic kidney disease.  She has history of hypertension and diabetes. As part of the CKD work-up, serum protein electrophoresis showed abnormal monoclonal spike 0.8 g/dL.  Total protein 8.2. Patient was referred to me for additional work-up and evaluation. Patient reports feeling tired and fatigued for the past few months.  Appetite is not good.  Denies any pain, fever, chills, night sweating, back pain. She was accompanied by her daughter  INTERVAL HISTORY Sue Hayes is a 69 y.o. female who has above history reviewed by me today presents for follow up visit for management of IgG lambda MGUS Problems and complaints are listed below: Patient has no new complaints. Chronic fatigue is unchanged.  She has gained some weight comparing to the last weight that was recorded in the EMR in January. Review of Systems  Constitutional: Negative for appetite change, chills, fatigue and fever.  HENT:   Negative for hearing loss and voice change.   Eyes: Negative for eye problems.  Respiratory: Negative for chest tightness and cough.   Cardiovascular: Negative for chest pain.  Gastrointestinal: Negative for abdominal distention, abdominal pain and blood in stool.  Endocrine: Negative for hot flashes.  Genitourinary: Negative for difficulty urinating and frequency.   Musculoskeletal: Negative for arthralgias.  Skin: Negative for itching and rash.   Neurological: Negative for extremity weakness.  Hematological: Negative for adenopathy.  Psychiatric/Behavioral: Negative for confusion.    MEDICAL HISTORY:  Past Medical History:  Diagnosis Date  . Acid reflux disease   . Ankle fracture, right   . Arthritis   . Cancer ([facility])    cervical cancer  . Fracture of capitate bone of wrist   . HTN (hypertension)   . Panic disorder with agoraphobia and moderate panic attacks   . Recurrent major depression-severe ([place])   . Sleep apnea    CPAP/ dosen't use regularly  . Type II diabetes mellitus ([place])     SURGICAL HISTORY: Past Surgical History:  Procedure Laterality Date  . CHOLECYSTECTOMY    . KNEE ARTHROPLASTY Right 06/11/2019   Procedure: COMPUTER ASSISTED TOTAL KNEE ARTHROPLASTY;  Surgeon: Dereck Leep, MD;  Location: [facility] ORS;  Service: Orthopedics;  Laterality: Right;  . KNEE ARTHROSCOPY Bilateral   . TUBAL LIGATION      SOCIAL HISTORY: Social History   Socioeconomic History  . Marital status: Divorced    Spouse name: Not on file  . Number of children: Not on file  . Years of education: Not on file  . Highest education level: Not on file  Occupational History  . Not on file  Tobacco Use  . Smoking status: Never Smoker  . Smokeless tobacco: Never Used  Vaping Use  . Vaping Use: Never used  Substance and Sexual Activity  . Alcohol use: Not Currently    Alcohol/week: 5.0 standard drinks    Types: 5 Glasses of wine per week  . Drug use: Never  . Sexual activity: Not on file  Other Topics Concern  .  Not on file  Social History Narrative  . Not on file   Social Determinants of Health   Financial Resource Strain: Not on file  Food Insecurity: Not on file  Transportation Needs: Not on file  Physical Activity: Not on file  Stress: Not on file  Social Connections: Not on file  Intimate Partner Violence: Not on file    FAMILY HISTORY: Family History  Problem Relation Age of Onset  . Depression Mother   .  Uterine cancer Mother   . Diabetes Mother   . Heart disease Mother   . Bipolar disorder Sister   . Depression Sister   . Melanoma Sister   . Breast cancer Cousin   . Bone cancer Father   . Lung cancer Father   . Melanoma Brother   . Diabetes Brother   . Melanoma Sister   . Uterine cancer Daughter     ALLERGIES:  is allergic to penicillins and zolpidem.  MEDICATIONS:  Current Outpatient Medications  Medication Sig Dispense Refill  . acetaminophen (TYLENOL) 500 MG tablet Take 500 mg by mouth 2 (two) times daily as needed for moderate pain or headache.    Marland Kitchen atenolol (TENORMIN) 25 MG tablet Take 25 mg by mouth daily.     . busPIRone (BUSPAR) 5 MG tablet Take 5 mg by mouth 2 (two) times daily.    . DULoxetine (CYMBALTA) 60 MG capsule Take 60 mg by mouth 2 (two) times daily.     . enalapril (VASOTEC) 10 MG tablet Take 10 mg by mouth daily.     Marland Kitchen esomeprazole (NEXIUM) 40 MG capsule Take 40 mg by mouth daily.     . ferrous sulfate 325 (65 FE) MG tablet Take 325 mg by mouth daily.    . hydrochlorothiazide (HYDRODIURIL) 25 MG tablet Take 25 mg by mouth daily.     . metFORMIN (GLUCOPHAGE) 500 MG tablet Take 500 mg by mouth 2 (two) times daily.     . rosuvastatin (CRESTOR) 40 MG tablet Take 40 mg by mouth every evening.     No current facility-administered medications for this visit.     PHYSICAL EXAMINATION: ECOG PERFORMANCE STATUS: 1 - Symptomatic but completely ambulatory Vitals:   09/24/20 1024  BP: 135/66  Pulse: 80  Resp: 16  Temp: 97.8 F (36.6 C)  SpO2: 99%   Filed Weights   09/24/20 1024  Weight: 193 lb 12.8 oz (87.9 kg)    Physical Exam Constitutional:      General: She is not in acute distress. HENT:     Head: Normocephalic and atraumatic.  Eyes:     General: No scleral icterus.    Pupils: Pupils are equal, round, and reactive to light.  Cardiovascular:     Rate and Rhythm: Normal rate and regular rhythm.     Heart sounds: Normal heart sounds.  Pulmonary:      Effort: Pulmonary effort is normal. No respiratory distress.     Breath sounds: No wheezing.  Abdominal:     General: Bowel sounds are normal. There is no distension.     Palpations: Abdomen is soft. There is no mass.     Tenderness: There is no abdominal tenderness.  Musculoskeletal:        General: No deformity. Normal range of motion.     Cervical back: Normal range of motion and neck supple.  Skin:    General: Skin is warm and dry.     Findings: No erythema or rash.  Neurological:  Mental Status: She is alert and oriented to person, place, and time.     Cranial Nerves: No cranial nerve deficit.     Coordination: Coordination normal.  Psychiatric:        Behavior: Behavior normal.        Thought Content: Thought content normal.     LABORATORY DATA:  I have reviewed the data as listed Lab Results  Component Value Date   WBC 7.9 09/20/2020   HGB 11.4 (L) 09/20/2020   HCT 34.8 (L) 09/20/2020   MCV 90.4 09/20/2020   PLT 274 09/20/2020   Recent Labs    11/05/19 1609 11/17/19 1121 03/22/20 1302 09/20/20 1502  NA  --  133* 138 137  K  --  3.5 3.9 3.4*  CL  --  94* 100 98  CO2  --  27 28 25   GLUCOSE  --  174* 130* 126*  BUN  --  22 20 26*  CREATININE  --  1.77* 1.25* 1.26*  CALCIUM  --  10.0 9.2 9.5  GFRNONAA  --  29* 44* 46*  GFRAA  --  34* 51*  --   PROT 8.6*  --  7.7 7.8  ALBUMIN 4.3  --  4.0 4.4  AST 20  --  23 30  ALT 19  --  20 32  ALKPHOS 62  --  59 53  BILITOT 0.8  --  0.6 0.7  BILIDIR <0.1  --   --   --   IBILI NOT CALCULATED  --   --   --    Iron/TIBC/Ferritin/ %Sat    Component Value Date/Time   IRON 71 11/17/2019 1121   TIBC 385 11/17/2019 1121   FERRITIN 63 11/17/2019 1121   IRONPCTSAT 18 11/17/2019 1121      RADIOGRAPHIC STUDIES: I have personally reviewed the radiological images as listed and agreed with the findings in the report. No results found.   ASSESSMENT & PLAN:  1. MGUS (monoclonal gammopathy of unknown significance)    2. Normocytic anemia    #IgG lambda MGUS, Labs are reviewed and discussed with patient.  Stable M protein at 0.6. Check NT proBNP once a year.  #Normocytic anemia, her hemoglobin has remained stable.  Likely anemia due to CKD.  Orders Placed This Encounter  Procedures  . CBC with Differential/Platelet    Standing Status:   Future    Standing Expiration Date:   09/24/2021  . Comprehensive metabolic panel    Standing Status:   Future    Standing Expiration Date:   09/24/2021  . Kappa/lambda light chains    Standing Status:   Future    Standing Expiration Date:   09/24/2021  . Multiple Myeloma Panel (SPEP&IFE w/QIG)    Standing Status:   Future    Standing Expiration Date:   09/24/2021    All questions were answered. The patient knows to call the clinic with any problems questions or concerns.  cc Gayland Curry, MD    Return of visit: 6 months   Earlie Server, MD, PhD Hematology Oncology [facility] Pager- [phone] 09/24/2020

## 2020-09-24 NOTE — Progress Notes (Signed)
Patient denies new problems/concerns today.   °

## 2021-02-07 ENCOUNTER — Other Ambulatory Visit: Payer: Self-pay | Admitting: Family Medicine

## 2021-02-28 ENCOUNTER — Other Ambulatory Visit: Payer: Self-pay

## 2021-02-28 ENCOUNTER — Ambulatory Visit
Admission: RE | Admit: 2021-02-28 | Discharge: 2021-02-28 | Disposition: A | Discharge status: Home / Self Care | Payer: Medicare Other | Source: Ambulatory Visit | Attending: Gastroenterology | Admitting: Gastroenterology

## 2021-02-28 ENCOUNTER — Ambulatory Visit: Payer: Medicare Other | Admitting: Anesthesiology

## 2021-02-28 ENCOUNTER — Encounter: Payer: Self-pay | Admitting: Anesthesiology

## 2021-02-28 ENCOUNTER — Encounter
Admission: RE | Disposition: A | Discharge status: Home / Self Care | Payer: Self-pay | Source: Ambulatory Visit | Attending: Gastroenterology

## 2021-02-28 DIAGNOSIS — Z1211 Encounter for screening for malignant neoplasm of colon: Secondary | ICD-10-CM | POA: Diagnosis not present

## 2021-02-28 DIAGNOSIS — Z888 Allergy status to other drugs, medicaments and biological substances status: Secondary | ICD-10-CM | POA: Insufficient documentation

## 2021-02-28 DIAGNOSIS — D124 Benign neoplasm of descending colon: Secondary | ICD-10-CM | POA: Insufficient documentation

## 2021-02-28 DIAGNOSIS — K640 First degree hemorrhoids: Secondary | ICD-10-CM | POA: Insufficient documentation

## 2021-02-28 DIAGNOSIS — E119 Type 2 diabetes mellitus without complications: Secondary | ICD-10-CM | POA: Diagnosis not present

## 2021-02-28 DIAGNOSIS — Z880 Allergy status to penicillin: Secondary | ICD-10-CM | POA: Diagnosis not present

## 2021-02-28 DIAGNOSIS — D123 Benign neoplasm of transverse colon: Secondary | ICD-10-CM | POA: Diagnosis not present

## 2021-02-28 DIAGNOSIS — I10 Essential (primary) hypertension: Secondary | ICD-10-CM | POA: Insufficient documentation

## 2021-02-28 DIAGNOSIS — Z7984 Long term (current) use of oral hypoglycemic drugs: Secondary | ICD-10-CM | POA: Insufficient documentation

## 2021-02-28 DIAGNOSIS — Z79899 Other long term (current) drug therapy: Secondary | ICD-10-CM | POA: Diagnosis not present

## 2021-02-28 HISTORY — PX: COLONOSCOPY WITH PROPOFOL: SHX5780

## 2021-02-28 LAB — GLUCOSE, CAPILLARY: Glucose-Capillary: 176 mg/dL — ABNORMAL HIGH (ref 70–99)

## 2021-02-28 SURGERY — COLONOSCOPY WITH PROPOFOL
Anesthesia: General

## 2021-02-28 MED ORDER — PROPOFOL 500 MG/50ML IV EMUL
INTRAVENOUS | Status: AC
  Filled 2021-02-28: qty 50

## 2021-02-28 MED ORDER — SODIUM CHLORIDE 0.9 % IV SOLN: INTRAVENOUS | Status: DC

## 2021-02-28 MED ORDER — PROPOFOL 500 MG/50ML IV EMUL
INTRAVENOUS | Status: DC | PRN
  Administered 2021-02-28: 150 ug/kg/min via INTRAVENOUS

## 2021-02-28 NOTE — Anesthesia Postprocedure Evaluation (Signed)
Anesthesia Post Note  Patient: Sue Hayes  Procedure(s) Performed: COLONOSCOPY WITH PROPOFOL (N/A )  Patient location during evaluation: Endoscopy Anesthesia Type: General Level of consciousness: awake and alert Pain management: pain level controlled Vital Signs Assessment: post-procedure vital signs reviewed and stable Respiratory status: spontaneous breathing, nonlabored ventilation, respiratory function stable and patient connected to nasal cannula oxygen Cardiovascular status: blood pressure returned to baseline and stable Postop Assessment: no apparent nausea or vomiting Anesthetic complications: no   No complications documented.   Last Vitals:  Vitals:   02/28/21 0855 02/28/21 0905  BP: 104/69 121/67  Pulse: 61 (!) 54  Resp: 15 14  Temp:    SpO2: 97% 99%    Last Pain:  Vitals:   02/28/21 0905  TempSrc:   PainSc: 0-No pain                 Martha Clan

## 2021-02-28 NOTE — Op Note (Signed)
[facility] Gastroenterology Patient Name: Sue Hayes Procedure Date: 02/28/2021 8:14 AM MRN: [id number] Account #: [id number] Date of Birth: 12-May-1951 Admit Type: Outpatient Age: 70 Room: [facility] ENDO ROOM 3 Gender: Female Note Status: Finalized Procedure:             Colonoscopy Indications:           Screening for colorectal malignant neoplasm Providers:             Andrey Farmer MD, MD Referring MD:          Delana Meyer (Referring MD) Medicines:             Monitored Anesthesia Care Complications:         No immediate complications. Estimated blood loss:                         Minimal. Procedure:             Pre-Anesthesia Assessment:                        - Prior to the procedure, a History and Physical was                         performed, and patient medications and allergies were                         reviewed. The patient is competent. The risks and                         benefits of the procedure and the sedation options and                         risks were discussed with the patient. All questions                         were answered and informed consent was obtained.                         Patient identification and proposed procedure were                         verified by the physician, the nurse, the anesthetist                         and the technician in the endoscopy suite. Mental                         Status Examination: alert and oriented. Airway                         Examination: normal oropharyngeal airway and neck                         mobility. Respiratory Examination: clear to                         auscultation. CV Examination: normal. Prophylactic                         Antibiotics:  The patient does not require prophylactic                         antibiotics. Prior Anticoagulants: The patient has                         taken no previous anticoagulant or antiplatelet                         agents. ASA Grade  Assessment: II - A patient with mild                         systemic disease. After reviewing the risks and                         benefits, the patient was deemed in satisfactory                         condition to undergo the procedure. The anesthesia                         plan was to use monitored anesthesia care (MAC).                         Immediately prior to administration of medications,                         the patient was re-assessed for adequacy to receive                         sedatives. The heart rate, respiratory rate, oxygen                         saturations, blood pressure, adequacy of pulmonary                         ventilation, and response to care were monitored                         throughout the procedure. The physical status of the                         patient was re-assessed after the procedure.                        After obtaining informed consent, the colonoscope was                         passed under direct vision. Throughout the procedure,                         the patient's blood pressure, pulse, and oxygen                         saturations were monitored continuously. The                         Colonoscope was introduced through the anus and  advanced to the the cecum, identified by appendiceal                         orifice and ileocecal valve. The colonoscopy was                         performed without difficulty. The patient tolerated                         the procedure well. The quality of the bowel                         preparation was good. Findings:      The perianal and digital rectal examinations were normal.      A 2 mm polyp was found in the transverse colon. The polyp was sessile.       The polyp was removed with a jumbo cold forceps. Resection and retrieval       were complete. Estimated blood loss was minimal.      A 3 mm polyp was found in the proximal descending colon. The polyp was        sessile. The polyp was removed with a cold snare. Resection and       retrieval were complete. Estimated blood loss was minimal.      Internal hemorrhoids were found during retroflexion. The hemorrhoids       were Grade I (internal hemorrhoids that do not prolapse).      The exam was otherwise without abnormality on direct and retroflexion       views. Impression:            - One 2 mm polyp in the transverse colon, removed with                         a jumbo cold forceps. Resected and retrieved.                        - One 3 mm polyp in the proximal descending colon,                         removed with a cold snare. Resected and retrieved.                        - Internal hemorrhoids.                        - The examination was otherwise normal on direct and                         retroflexion views. Recommendation:        - Discharge patient to home.                        - Resume previous diet.                        - Continue present medications.                        - Await pathology results.                        -  Repeat colonoscopy for surveillance based on                         pathology results.                        - Return to referring physician as previously                         scheduled. Procedure Code(s):     --- Professional ---                        [phone], Colonoscopy, flexible; with removal of                         tumor(s), polyp(s), or other lesion(s) by snare                         technique                        [id number], 49, Colonoscopy, flexible; with biopsy, single                         or multiple Diagnosis Code(s):     --- Professional ---                        Z12.11, Encounter for screening for malignant neoplasm                         of colon                        K63.5, Polyp of colon                        K64.0, First degree hemorrhoids CPT copyright 2019 [employer]. All rights reserved. The codes  documented in this report are preliminary and upon coder review may  be revised to meet current compliance requirements. Andrey Farmer MD, MD 02/28/2021 8:44:54 AM Number of Addenda: 0 Note Initiated On: 02/28/2021 8:14 AM Scope Withdrawal Time: 0 hours 7 minutes 8 seconds  Total Procedure Duration: 0 hours 12 minutes 55 seconds  Estimated Blood Loss:  Estimated blood loss was minimal.      [facility]

## 2021-02-28 NOTE — Anesthesia Procedure Notes (Signed)
Performed by: Cook-Martin, Tiran Sauseda Pre-anesthesia Checklist: Patient identified, Emergency Drugs available, Suction available, Patient being monitored and Timeout performed Patient Re-evaluated:Patient Re-evaluated prior to induction Oxygen Delivery Method: Nasal cannula Preoxygenation: Pre-oxygenation with 100% oxygen Induction Type: IV induction Airway Equipment and Method: Oral airway Placement Confirmation: positive ETCO2 and CO2 detector       

## 2021-02-28 NOTE — Anesthesia Preprocedure Evaluation (Signed)
Anesthesia Evaluation  Patient identified by MRN, date of birth, ID band Patient awake    Reviewed: Allergy & Precautions, NPO status , Patient's Chart, lab work & pertinent test results  History of Anesthesia Complications Negative for: history of anesthetic complications  Airway Mallampati: III     Mouth opening: Limited Mouth Opening  Dental  (+) Upper Dentures, Partial Lower, Dental Advidsory Given   Pulmonary neg shortness of breath, sleep apnea and Continuous Positive Airway Pressure Ventilation , neg COPD, neg recent URI,    Pulmonary exam normal        Cardiovascular hypertension, (-) angina(-) Past MI and (-) Cardiac Stents Normal cardiovascular exam(-) dysrhythmias (-) Valvular Problems/Murmurs     Neuro/Psych PSYCHIATRIC DISORDERS Anxiety Depression negative neurological ROS     GI/Hepatic Neg liver ROS, GERD  ,  Endo/Other  diabetes  Renal/GU negative Renal ROS  negative genitourinary   Musculoskeletal  (+) Arthritis , Osteoarthritis,    Abdominal Normal abdominal exam  (+)   Peds negative pediatric ROS (+)  Hematology negative hematology ROS (+)   Anesthesia Other Findings Past Medical History: No date: Acid reflux disease No date: Ankle fracture, right No date: Arthritis No date: Cancer [facility])     Comment:  cervical cancer No date: Fracture of capitate bone of wrist No date: HTN (hypertension) No date: Panic disorder with agoraphobia and moderate panic attacks No date: Recurrent major depression-severe ([place]) No date: Sleep apnea     Comment:  CPAP/ dosen't use regularly No date: Type II diabetes mellitus ([place])  Reproductive/Obstetrics                             Anesthesia Physical  Anesthesia Plan  ASA: II  Anesthesia Plan: General   Post-op Pain Management:    Induction: Intravenous  PONV Risk Score and Plan: 3 and TIVA and Propofol infusion  Airway  Management Planned: Nasal Cannula and Natural Airway  Additional Equipment:   Intra-op Plan:   Post-operative Plan:   Informed Consent: I have reviewed the patients History and Physical, chart, labs and discussed the procedure including the risks, benefits and alternatives for the proposed anesthesia with the patient or authorized representative who has indicated his/her understanding and acceptance.     Dental advisory given  Plan Discussed with: CRNA and Surgeon  Anesthesia Plan Comments:         Anesthesia Quick Evaluation

## 2021-02-28 NOTE — Interval H&P Note (Signed)
History and Physical Interval Note:  02/28/2021 8:20 AM  Sue Hayes  has presented today for surgery, with the diagnosis of SCREEN.  The various methods of treatment have been discussed with the patient and family. After consideration of risks, benefits and other options for treatment, the patient has consented to  Procedure(s) with comments: COLONOSCOPY WITH PROPOFOL (N/A) - DM as a surgical intervention.  The patient's history has been reviewed, patient examined, no change in status, stable for surgery.  I have reviewed the patient's chart and labs.  Questions were answered to the patient's satisfaction.     Lesly Rubenstein  Ok to proceed with colonoscopy

## 2021-02-28 NOTE — Transfer of Care (Signed)
Immediate Anesthesia Transfer of Care Note  Patient: Sue Hayes  Procedure(s) Performed: COLONOSCOPY WITH PROPOFOL (N/A )  Patient Location: PACU  Anesthesia Type:General  Level of Consciousness: awake and sedated  Airway & Oxygen Therapy: Patient Spontanous Breathing and Patient connected to nasal cannula oxygen  Post-op Assessment: Report given to RN and Post -op Vital signs reviewed and stable  Post vital signs: Reviewed and stable  Last Vitals:  Vitals Value Taken Time  BP    Temp    Pulse    Resp    SpO2      Last Pain:  Vitals:   02/28/21 0737  TempSrc: Temporal  PainSc: 0-No pain         Complications: No complications documented.

## 2021-02-28 NOTE — H&P (Signed)
Outpatient short stay form Pre-procedure 02/28/2021 8:18 AM Sue Miyamoto MD, MPH  Primary Physician: Sue Hayes  Reason for visit:  Screening colonoscopy  History of present illness:   Sue Hayes is 70 y/o lady with hypertension and DM II here for screening colonoscopy. No family history of GI malignancies. No blood thinners. History of cholecystectomy. No new symptoms, has chronic shortness of breath which has been evaluated thoroughly in the past for cardiac causes and have been negative.    Current Facility-Administered Medications:  .  0.9 %  sodium chloride infusion, , Intravenous, Continuous, Nadir Vasques, Hilton Cork, MD, Last Rate: 20 mL/hr at 02/28/21 0751, New Bag at 02/28/21 0751  Medications Prior to Admission  Medication Sig Dispense Refill Last Dose  . acetaminophen (TYLENOL) 500 MG tablet Take 500 mg by mouth 2 (two) times daily as needed for moderate pain or headache.   02/27/2021 at Unknown time  . atenolol (TENORMIN) 25 MG tablet Take 25 mg by mouth daily.    02/28/2021 at 0600  . busPIRone (BUSPAR) 5 MG tablet Take 5 mg by mouth 2 (two) times daily.   02/27/2021 at 0800  . DULoxetine (CYMBALTA) 60 MG capsule Take 60 mg by mouth 2 (two) times daily.    02/28/2021 at 0600  . enalapril (VASOTEC) 10 MG tablet Take 10 mg by mouth daily.    02/28/2021 at 0600  . esomeprazole (NEXIUM) 40 MG capsule Take 40 mg by mouth daily.    02/28/2021 at 0600  . ferrous sulfate 325 (65 FE) MG tablet Take 325 mg by mouth daily.   Past Week at Unknown time  . hydrochlorothiazide (HYDRODIURIL) 25 MG tablet Take 25 mg by mouth daily.    02/28/2021 at 0600  . metFORMIN (GLUCOPHAGE) 500 MG tablet Take 500 mg by mouth 2 (two) times daily.    02/27/2021 at 1800  . rosuvastatin (CRESTOR) 40 MG tablet Take 40 mg by mouth every evening.   02/27/2021 at 1800     Allergies  Allergen Reactions  . Penicillins Shortness Of Breath    Did it involve swelling of the face/tongue/throat, SOB, or low BP? Yes Did it  involve sudden or severe rash/hives, skin peeling, or any reaction on the inside of your mouth or nose? No Did you need to seek medical attention at a hospital or doctor's office? Yes When did it last happen?40 + years If all above answers are "NO", may proceed with cephalosporin use.   Marland Kitchen Zolpidem Other (See Comments)    Sleep walking      Past Medical History:  Diagnosis Date  . Acid reflux disease   . Ankle fracture, right   . Arthritis   . Cancer (HCC)    cervical cancer  . Fracture of capitate bone of wrist   . HTN (hypertension)   . Panic disorder with agoraphobia and moderate panic attacks   . Recurrent major depression-severe ([place])   . Sleep apnea    CPAP/ dosen't use regularly  . Type II diabetes mellitus ([place])     Review of systems:  Otherwise negative.    Physical Exam  Gen: Alert, oriented. Appears stated age.  HEENT: PERRLA. Lungs: No respiratory distress CV: RRR Abd: soft, benign, no masses Ext: No edema    Planned procedures: Proceed with colonoscopy. The patient understands the nature of the planned procedure, indications, risks, alternatives and potential complications including but not limited to bleeding, infection, perforation, damage to internal organs and possible oversedation/side effects from anesthesia. The  patient agrees and gives consent to proceed.  Please refer to procedure notes for findings, recommendations and patient disposition/instructions.     Sue Miyamoto MD, MPH Gastroenterology 02/28/2021  8:18 AM

## 2021-03-01 LAB — SURGICAL PATHOLOGY

## 2021-03-17 ENCOUNTER — Other Ambulatory Visit: Payer: Medicare Other

## 2021-03-24 ENCOUNTER — Inpatient Hospital Stay: Payer: Medicare Other | Attending: Oncology

## 2021-03-24 ENCOUNTER — Ambulatory Visit: Payer: Medicare Other | Admitting: Oncology

## 2021-03-24 DIAGNOSIS — Z7984 Long term (current) use of oral hypoglycemic drugs: Secondary | ICD-10-CM | POA: Diagnosis not present

## 2021-03-24 DIAGNOSIS — K219 Gastro-esophageal reflux disease without esophagitis: Secondary | ICD-10-CM | POA: Diagnosis not present

## 2021-03-24 DIAGNOSIS — R5382 Chronic fatigue, unspecified: Secondary | ICD-10-CM | POA: Diagnosis not present

## 2021-03-24 DIAGNOSIS — N189 Chronic kidney disease, unspecified: Secondary | ICD-10-CM | POA: Insufficient documentation

## 2021-03-24 DIAGNOSIS — E1122 Type 2 diabetes mellitus with diabetic chronic kidney disease: Secondary | ICD-10-CM | POA: Insufficient documentation

## 2021-03-24 DIAGNOSIS — I129 Hypertensive chronic kidney disease with stage 1 through stage 4 chronic kidney disease, or unspecified chronic kidney disease: Secondary | ICD-10-CM | POA: Insufficient documentation

## 2021-03-24 DIAGNOSIS — Z79899 Other long term (current) drug therapy: Secondary | ICD-10-CM | POA: Insufficient documentation

## 2021-03-24 DIAGNOSIS — D472 Monoclonal gammopathy: Secondary | ICD-10-CM | POA: Diagnosis not present

## 2021-03-24 DIAGNOSIS — D649 Anemia, unspecified: Secondary | ICD-10-CM

## 2021-03-24 LAB — CBC WITH DIFFERENTIAL/PLATELET
Abs Immature Granulocytes: 0.02 10*3/uL (ref 0.00–0.07)
Basophils Absolute: 0.1 10*3/uL (ref 0.0–0.1)
Basophils Relative: 1 %
Eosinophils Absolute: 0.1 10*3/uL (ref 0.0–0.5)
Eosinophils Relative: 2 %
HCT: 34.7 % — ABNORMAL LOW (ref 36.0–46.0)
Hemoglobin: 11.6 g/dL — ABNORMAL LOW (ref 12.0–15.0)
Immature Granulocytes: 0 %
Lymphocytes Relative: 26 %
Lymphs Abs: 1.7 10*3/uL (ref 0.7–4.0)
MCH: 29.6 pg (ref 26.0–34.0)
MCHC: 33.4 g/dL (ref 30.0–36.0)
MCV: 88.5 fL (ref 80.0–100.0)
Monocytes Absolute: 0.5 10*3/uL (ref 0.1–1.0)
Monocytes Relative: 8 %
Neutro Abs: 4.1 10*3/uL (ref 1.7–7.7)
Neutrophils Relative %: 63 %
Platelets: 277 10*3/uL (ref 150–400)
RBC: 3.92 MIL/uL (ref 3.87–5.11)
RDW: 13.2 % (ref 11.5–15.5)
WBC: 6.6 10*3/uL (ref 4.0–10.5)
nRBC: 0 % (ref 0.0–0.2)

## 2021-03-24 LAB — COMPREHENSIVE METABOLIC PANEL
ALT: 31 U/L (ref 0–44)
AST: 33 U/L (ref 15–41)
Albumin: 4.1 g/dL (ref 3.5–5.0)
Alkaline Phosphatase: 63 U/L (ref 38–126)
Anion gap: 12 (ref 5–15)
BUN: 20 mg/dL (ref 8–23)
CO2: 26 mmol/L (ref 22–32)
Calcium: 9.2 mg/dL (ref 8.9–10.3)
Chloride: 96 mmol/L — ABNORMAL LOW (ref 98–111)
Creatinine, Ser: 1.2 mg/dL — ABNORMAL HIGH (ref 0.44–1.00)
GFR, Estimated: 49 mL/min — ABNORMAL LOW (ref >60)
Glucose, Bld: 172 mg/dL — ABNORMAL HIGH (ref 70–99)
Potassium: 3.5 mmol/L (ref 3.5–5.1)
Sodium: 134 mmol/L — ABNORMAL LOW (ref 135–145)
Total Bilirubin: 0.7 mg/dL (ref 0.3–1.2)
Total Protein: 7.8 g/dL (ref 6.5–8.1)

## 2021-03-25 LAB — KAPPA/LAMBDA LIGHT CHAINS
Kappa free light chain: 33.6 mg/L — ABNORMAL HIGH (ref 3.3–19.4)
Kappa, lambda light chain ratio: 0.53 (ref 0.26–1.65)
Lambda free light chains: 63.8 mg/L — ABNORMAL HIGH (ref 5.7–26.3)

## 2021-03-25 LAB — MISC LABCORP TEST (SEND OUT): Labcorp test code: 14300

## 2021-03-28 LAB — MULTIPLE MYELOMA PANEL, SERUM
Albumin SerPl Elph-Mcnc: 4.1 g/dL (ref 2.9–4.4)
Albumin/Glob SerPl: 1.3 (ref 0.7–1.7)
Alpha 1: 0.2 g/dL (ref 0.0–0.4)
Alpha2 Glob SerPl Elph-Mcnc: 0.9 g/dL (ref 0.4–1.0)
B-Globulin SerPl Elph-Mcnc: 1 g/dL (ref 0.7–1.3)
Gamma Glob SerPl Elph-Mcnc: 1.1 g/dL (ref 0.4–1.8)
Globulin, Total: 3.2 g/dL (ref 2.2–3.9)
IgA: 173 mg/dL (ref 87–352)
IgG (Immunoglobin G), Serum: 1317 mg/dL (ref 586–1602)
IgM (Immunoglobulin M), Srm: 60 mg/dL (ref 26–217)
M Protein SerPl Elph-Mcnc: 0.6 g/dL — ABNORMAL HIGH
Total Protein ELP: 7.3 g/dL (ref 6.0–8.5)

## 2021-03-31 ENCOUNTER — Inpatient Hospital Stay: Payer: Medicare Other | Admitting: Nurse Practitioner

## 2021-03-31 ENCOUNTER — Encounter: Payer: Self-pay | Admitting: Nurse Practitioner

## 2021-03-31 VITALS — BP 116/74 | HR 70 | Temp 98.4°F | Resp 16 | Ht 67.0 in | Wt 191.2 lb

## 2021-03-31 DIAGNOSIS — D472 Monoclonal gammopathy: Secondary | ICD-10-CM | POA: Diagnosis not present

## 2021-03-31 NOTE — Progress Notes (Signed)
Hematology/Oncology Progress Note [facility] [phone] [phone]   Patient Care Team: Gayland Curry, MD as PCP - General (Family Medicine)  REFERRING PROVIDER: Gayland Curry, MD  CHIEF COMPLAINTS/REASON FOR VISIT:  Follow-up for IgG lambda MGUS  HISTORY OF PRESENTING ILLNESS:   Sue Hayes is a  70 y.o.  female with PMH listed below was seen in consultation at the request of  Gayland Curry, MD  for evaluation of abnormal protein electrophoresis. Patient follows up with Dr. Holley Raring for chronic kidney disease.  She has history of hypertension and diabetes. As part of the CKD work-up, serum protein electrophoresis showed abnormal monoclonal spike 0.8 g/dL.  Total protein 8.2.  Patient was referred to hematology oncology for additional work-up and evaluation.  He was tired and fatigued at that time.  Appetite was poor.  Denied pain, fever, chills, night sweats, back pain.   INTERVAL HISTORY Sue Hayes is a 70 y.o. female who returns to clinic for follow-up visit for management of IgG lambda MGUS.  She denies any new complaints.  Has chronic fatigue which is stable and unchanged.  Appetite is good and she denies unintentional weight loss.  No interval fevers or infections.  No night sweats or chills.  No bone pain.  No nausea, vomiting, diarrhea, constipation.  No urinary complaints.  No further specific complaints today.  Review of Systems  Constitutional:  Negative for appetite change, chills, fatigue and fever.  HENT:   Negative for hearing loss and voice change.   Eyes:  Negative for eye problems.  Respiratory:  Negative for chest tightness and cough.   Cardiovascular:  Negative for chest pain.  Gastrointestinal:  Negative for abdominal distention, abdominal pain and blood in stool.  Endocrine: Negative for hot flashes.  Genitourinary:  Negative for difficulty urinating and frequency.   Musculoskeletal:  Negative for  arthralgias.  Skin:  Negative for itching and rash.  Neurological:  Negative for extremity weakness.  Hematological:  Negative for adenopathy.  Psychiatric/Behavioral:  Negative for confusion.    MEDICAL HISTORY:  Past Medical History:  Diagnosis Date   Acid reflux disease    Ankle fracture, right    Arthritis    Cancer ([place])    cervical cancer   Fracture of capitate bone of wrist    HTN (hypertension)    Monoclonal gammopathy of unknown significance    Panic disorder with agoraphobia and moderate panic attacks    Recurrent major depression-severe ([place])    Sleep apnea    CPAP/ dosen't use regularly   Type II diabetes mellitus ([place])     SURGICAL HISTORY: Past Surgical History:  Procedure Laterality Date   CHOLECYSTECTOMY     COLONOSCOPY WITH PROPOFOL N/A 02/28/2021   Procedure: COLONOSCOPY WITH PROPOFOL;  Surgeon: Lesly Rubenstein, MD;  Location: [facility] ENDOSCOPY;  Service: Endoscopy;  Laterality: N/A;  DM   JOINT REPLACEMENT     KNEE ARTHROPLASTY Right 06/11/2019   Procedure: COMPUTER ASSISTED TOTAL KNEE ARTHROPLASTY;  Surgeon: Dereck Leep, MD;  Location: [facility] ORS;  Service: Orthopedics;  Laterality: Right;   KNEE ARTHROSCOPY Bilateral    TUBAL LIGATION      SOCIAL HISTORY: Social History   Socioeconomic History   Marital status: Divorced    Spouse name: Not on file   Number of children: Not on file   Years of education: Not on file   Highest education level: Not on file  Occupational History   Not on file  Tobacco Use  Smoking status: Never   Smokeless tobacco: Never  Vaping Use   Vaping Use: Never used  Substance and Sexual Activity   Alcohol use: Yes    Alcohol/week: 5.0 standard drinks    Types: 5 Glasses of wine per week    Comment: occ    Drug use: Never   Sexual activity: Not on file  Other Topics Concern   Not on file  Social History Narrative   Not on file   Social Determinants of Health   Financial Resource Strain: Not on file  Food  Insecurity: Not on file  Transportation Needs: Not on file  Physical Activity: Not on file  Stress: Not on file  Social Connections: Not on file  Intimate Partner Violence: Not on file    FAMILY HISTORY: Family History  Problem Relation Age of Onset   Depression Mother    Uterine cancer Mother    Diabetes Mother    Heart disease Mother    Bipolar disorder Sister    Depression Sister    Melanoma Sister    Breast cancer Cousin    Bone cancer Father    Lung cancer Father    Melanoma Brother    Diabetes Brother    Melanoma Sister    Uterine cancer Daughter     ALLERGIES:  is allergic to penicillins and zolpidem.  MEDICATIONS:  Current Outpatient Medications  Medication Sig Dispense Refill   acetaminophen (TYLENOL) 500 MG tablet Take 500 mg by mouth 2 (two) times daily as needed for moderate pain or headache.     atenolol (TENORMIN) 25 MG tablet Take 25 mg by mouth daily.      busPIRone (BUSPAR) 5 MG tablet Take 5 mg by mouth 2 (two) times daily.     Calcium Carbonate-Vitamin D3 600-400 MG-UNIT TABS Take 1 tablet by mouth daily.     DULoxetine (CYMBALTA) 60 MG capsule Take 60 mg by mouth 2 (two) times daily.      enalapril (VASOTEC) 10 MG tablet Take 10 mg by mouth daily.      esomeprazole (NEXIUM) 40 MG capsule Take 40 mg by mouth daily.      ferrous sulfate 325 (65 FE) MG tablet Take 325 mg by mouth daily.     hydrochlorothiazide (HYDRODIURIL) 25 MG tablet Take 25 mg by mouth daily.      metFORMIN (GLUCOPHAGE) 500 MG tablet Take 500 mg by mouth 2 (two) times daily.      rosuvastatin (CRESTOR) 40 MG tablet Take 40 mg by mouth every evening.     No current facility-administered medications for this visit.    PHYSICAL EXAMINATION: ECOG PERFORMANCE STATUS: 1 - Symptomatic but completely ambulatory Vitals:   03/31/21 1036  BP: 116/74  Pulse: 70  Resp: 16  Temp: 98.4 F (36.9 C)   Filed Weights   03/31/21 1036  Weight: 191 lb 3.2 oz (86.7 kg)    Physical  Exam Constitutional:      General: She is not in acute distress. HENT:     Head: Normocephalic and atraumatic.  Eyes:     General: No scleral icterus.    Pupils: Pupils are equal, round, and reactive to light.  Cardiovascular:     Rate and Rhythm: Normal rate and regular rhythm.     Heart sounds: Normal heart sounds.  Pulmonary:     Effort: Pulmonary effort is normal. No respiratory distress.     Breath sounds: No wheezing.  Abdominal:     General: Bowel sounds  are normal. There is no distension.     Palpations: Abdomen is soft. There is no mass.     Tenderness: There is no abdominal tenderness.  Musculoskeletal:        General: No deformity. Normal range of motion.     Cervical back: Normal range of motion and neck supple.  Skin:    General: Skin is warm and dry.     Findings: No erythema or rash.  Neurological:     Mental Status: She is alert and oriented to person, place, and time.     Cranial Nerves: No cranial nerve deficit.     Coordination: Coordination normal.  Psychiatric:        Behavior: Behavior normal.        Thought Content: Thought content normal.    LABORATORY DATA:  I have reviewed the data as listed Lab Results  Component Value Date   WBC 6.6 03/24/2021   HGB 11.6 (L) 03/24/2021   HCT 34.7 (L) 03/24/2021   MCV 88.5 03/24/2021   PLT 277 03/24/2021   Recent Labs    09/20/20 1502 03/24/21 1048  NA 137 134*  K 3.4* 3.5  CL 98 96*  CO2 25 26  GLUCOSE 126* 172*  BUN 26* 20  CREATININE 1.26* 1.20*  CALCIUM 9.5 9.2  GFRNONAA 46* 49*  PROT 7.8 7.8  ALBUMIN 4.4 4.1  AST 30 33  ALT 32 31  ALKPHOS 53 63  BILITOT 0.7 0.7    Iron/TIBC/Ferritin/ %Sat    Component Value Date/Time   IRON 71 11/17/2019 1121   TIBC 385 11/17/2019 1121   FERRITIN 63 11/17/2019 1121   IRONPCTSAT 18 11/17/2019 1121    Immunoglobulin Electrophoresis Latest Ref Rng & Units 03/24/2021  IgG 586 - 1,602 mg/dL 1,317  IgM 26 - 217 mg/dL 60    RADIOGRAPHIC STUDIES: I  have personally reviewed the radiological images as listed and agreed with the findings in the report. No results found.   ASSESSMENT & PLAN:  1. MGUS (monoclonal gammopathy of unknown significance)    1.  IgG lambda MGUS-labs reviewed today.  M protein is stable at 0.6.  Yearly NT proBNP is normal.  Clinically she remains asymptomatic.  Recommend continued surveillance with repeat MGUS labs in 6 months and reevaluation virtually few days later.  2.  Normocytic anemia-likely related to CKD which is stable and unchanged.  Hemoglobin is stable.  Monitor.  No orders of the defined types were placed in this encounter.   All questions were answered. The patient knows to call the clinic with any problems questions or concerns.  cc Gayland Curry, MD    Return of visit: 6 months for labs;few days later, virtual visit.    Earlie Server, MD, PhD Hematology Oncology [facility] Pager- [phone] 03/31/2021

## 2021-03-31 NOTE — Progress Notes (Signed)
Pt here and no issues .

## 2021-09-30 ENCOUNTER — Inpatient Hospital Stay: Payer: Medicare Other | Attending: Oncology

## 2021-09-30 ENCOUNTER — Other Ambulatory Visit: Payer: Self-pay

## 2021-09-30 DIAGNOSIS — Z79899 Other long term (current) drug therapy: Secondary | ICD-10-CM | POA: Diagnosis not present

## 2021-09-30 DIAGNOSIS — D649 Anemia, unspecified: Secondary | ICD-10-CM | POA: Insufficient documentation

## 2021-09-30 DIAGNOSIS — E1122 Type 2 diabetes mellitus with diabetic chronic kidney disease: Secondary | ICD-10-CM | POA: Insufficient documentation

## 2021-09-30 DIAGNOSIS — Z7984 Long term (current) use of oral hypoglycemic drugs: Secondary | ICD-10-CM | POA: Insufficient documentation

## 2021-09-30 DIAGNOSIS — N189 Chronic kidney disease, unspecified: Secondary | ICD-10-CM | POA: Diagnosis not present

## 2021-09-30 DIAGNOSIS — D472 Monoclonal gammopathy: Secondary | ICD-10-CM | POA: Insufficient documentation

## 2021-09-30 DIAGNOSIS — I129 Hypertensive chronic kidney disease with stage 1 through stage 4 chronic kidney disease, or unspecified chronic kidney disease: Secondary | ICD-10-CM | POA: Diagnosis not present

## 2021-09-30 LAB — CBC WITH DIFFERENTIAL/PLATELET
Abs Immature Granulocytes: 0.02 10*3/uL (ref 0.00–0.07)
Basophils Absolute: 0 10*3/uL (ref 0.0–0.1)
Basophils Relative: 1 %
Eosinophils Absolute: 0.1 10*3/uL (ref 0.0–0.5)
Eosinophils Relative: 2 %
HCT: 35.5 % — ABNORMAL LOW (ref 36.0–46.0)
Hemoglobin: 11.6 g/dL — ABNORMAL LOW (ref 12.0–15.0)
Immature Granulocytes: 0 %
Lymphocytes Relative: 29 %
Lymphs Abs: 2 10*3/uL (ref 0.7–4.0)
MCH: 29.4 pg (ref 26.0–34.0)
MCHC: 32.7 g/dL (ref 30.0–36.0)
MCV: 89.9 fL (ref 80.0–100.0)
Monocytes Absolute: 0.5 10*3/uL (ref 0.1–1.0)
Monocytes Relative: 8 %
Neutro Abs: 4.4 10*3/uL (ref 1.7–7.7)
Neutrophils Relative %: 60 %
Platelets: 283 10*3/uL (ref 150–400)
RBC: 3.95 MIL/uL (ref 3.87–5.11)
RDW: 13.9 % (ref 11.5–15.5)
WBC: 7.2 10*3/uL (ref 4.0–10.5)
nRBC: 0 % (ref 0.0–0.2)

## 2021-09-30 LAB — COMPREHENSIVE METABOLIC PANEL
ALT: 25 U/L (ref 0–44)
AST: 30 U/L (ref 15–41)
Albumin: 4.2 g/dL (ref 3.5–5.0)
Alkaline Phosphatase: 55 U/L (ref 38–126)
Anion gap: 14 (ref 5–15)
BUN: 24 mg/dL — ABNORMAL HIGH (ref 8–23)
CO2: 23 mmol/L (ref 22–32)
Calcium: 9.4 mg/dL (ref 8.9–10.3)
Chloride: 96 mmol/L — ABNORMAL LOW (ref 98–111)
Creatinine, Ser: 1.31 mg/dL — ABNORMAL HIGH (ref 0.44–1.00)
GFR, Estimated: 44 mL/min — ABNORMAL LOW (ref >60)
Glucose, Bld: 148 mg/dL — ABNORMAL HIGH (ref 70–99)
Potassium: 3.4 mmol/L — ABNORMAL LOW (ref 3.5–5.1)
Sodium: 133 mmol/L — ABNORMAL LOW (ref 135–145)
Total Bilirubin: 0.5 mg/dL (ref 0.3–1.2)
Total Protein: 7.9 g/dL (ref 6.5–8.1)

## 2021-10-03 ENCOUNTER — Inpatient Hospital Stay (HOSPITAL_BASED_OUTPATIENT_CLINIC_OR_DEPARTMENT_OTHER): Payer: Medicare Other | Admitting: Oncology

## 2021-10-03 VITALS — Wt 181.0 lb

## 2021-10-03 DIAGNOSIS — D649 Anemia, unspecified: Secondary | ICD-10-CM | POA: Diagnosis not present

## 2021-10-03 DIAGNOSIS — D472 Monoclonal gammopathy: Secondary | ICD-10-CM | POA: Diagnosis not present

## 2021-10-03 LAB — KAPPA/LAMBDA LIGHT CHAINS
Kappa free light chain: 35 mg/L — ABNORMAL HIGH (ref 3.3–19.4)
Kappa, lambda light chain ratio: 0.57 (ref 0.26–1.65)
Lambda free light chains: 61.3 mg/L — ABNORMAL HIGH (ref 5.7–26.3)

## 2021-10-03 NOTE — Progress Notes (Signed)
Pt reports some weight loss since last visit unsure of how much and "slight decrease" in appetite. Pt c/o worsening "numbness and tingling" in her hands, especially knuckles first thing in the morning. Pt states that she wears arthritis gloves with minimal improvement in symptoms.

## 2021-10-05 NOTE — Progress Notes (Signed)
Hematology/Oncology progress note [phone] [phone]   Patient Care Team: Gayland Curry, MD as PCP - General (Family Medicine)  REFERRING PROVIDER: Gayland Curry, MD  CHIEF COMPLAINTS/REASON FOR VISIT:  Follow-up for IgG lambda MGUS  HISTORY OF PRESENTING ILLNESS:   Sue Hayes is a  70 y.o.  female with PMH listed below was seen in consultation at the request of  Gayland Curry, MD  for evaluation of abnormal protein electrophoresis. Patient follows up with Dr. Holley Raring for chronic kidney disease.  She has history of hypertension and diabetes. As part of the CKD work-up, serum protein electrophoresis showed abnormal monoclonal spike 0.8 g/dL.  Total protein 8.2. Patient was referred to me for additional work-up and evaluation. Patient reports feeling tired and fatigued for the past few months.  Appetite is not good.  Denies any pain, fever, chills, night sweating, back pain. She was accompanied by her daughter  INTERVAL HISTORY Sue Hayes is a 70 y.o. female who has above history reviewed by me today presents for follow up visit for management of IgG lambda MGUS Patient has no new complaints.  Review of Systems  Constitutional:  Negative for appetite change, chills, fatigue and fever.  HENT:   Negative for hearing loss and voice change.   Eyes:  Negative for eye problems.  Respiratory:  Negative for chest tightness and cough.   Cardiovascular:  Negative for chest pain.  Gastrointestinal:  Negative for abdominal distention, abdominal pain and blood in stool.  Endocrine: Negative for hot flashes.  Genitourinary:  Negative for difficulty urinating and frequency.   Musculoskeletal:  Negative for arthralgias.  Skin:  Negative for itching and rash.  Neurological:  Negative for extremity weakness.  Hematological:  Negative for adenopathy.  Psychiatric/Behavioral:  Negative for confusion.    MEDICAL HISTORY:  Past Medical History:  Diagnosis  Date   Acid reflux disease    Ankle fracture, right    Arthritis    Cancer ([place])    cervical cancer   Fracture of capitate bone of wrist    HTN (hypertension)    Monoclonal gammopathy of unknown significance    Panic disorder with agoraphobia and moderate panic attacks    Recurrent major depression-severe ([place])    Sleep apnea    CPAP/ dosen't use regularly   Type II diabetes mellitus ([place])     SURGICAL HISTORY: Past Surgical History:  Procedure Laterality Date   CHOLECYSTECTOMY     COLONOSCOPY WITH PROPOFOL N/A 02/28/2021   Procedure: COLONOSCOPY WITH PROPOFOL;  Surgeon: Lesly Rubenstein, MD;  Location: [facility] END[facility];  Service: Endoscopy;  Laterality: N/A;  DM   JOINT REPLACEMENT     KNEE ARTHROPLASTY Right 06/11/2019   Procedure: COMPUTER ASSISTED TOTAL KNEE ARTHROPLASTY;  Surgeon: Dereck Leep, MD;  Location: [facility] ORS;  Service: Orthopedics;  Laterality: Right;   KNEE ARTHROSCOPY Bilateral    TUBAL LIGATION      SOCIAL HISTORY: Social History   Socioeconomic History   Marital status: Divorced    Spouse name: Not on file   Number of children: Not on file   Years of education: Not on file   Highest education level: Not on file  Occupational History   Not on file  Tobacco Use   Smoking status: Never   Smokeless tobacco: Never  Vaping Use   Vaping Use: Never used  Substance and Sexual Activity   Alcohol use: Yes    Alcohol/week: 5.0 standard drinks    Types: 5 Glasses of  wine per week    Comment: occ    Drug use: Never   Sexual activity: Not on file  Other Topics Concern   Not on file  Social History Narrative   Not on file   Social Determinants of Health   Financial Resource Strain: Not on file  Food Insecurity: Not on file  Transportation Needs: Not on file  Physical Activity: Not on file  Stress: Not on file  Social Connections: Not on file  Intimate Partner Violence: Not on file    FAMILY HISTORY: Family History  Problem Relation Age of  Onset   Depression Mother    Uterine cancer Mother    Diabetes Mother    Heart disease Mother    Bipolar disorder Sister    Depression Sister    Melanoma Sister    Breast cancer Cousin    Bone cancer Father    Lung cancer Father    Melanoma Brother    Diabetes Brother    Melanoma Sister    Uterine cancer Daughter     ALLERGIES:  is allergic to penicillins and zolpidem.  MEDICATIONS:  Current Outpatient Medications  Medication Sig Dispense Refill   acetaminophen (TYLENOL) 500 MG tablet Take 500 mg by mouth 2 (two) times daily as needed for moderate pain or headache.     atenolol (TENORMIN) 25 MG tablet Take 25 mg by mouth daily.      Calcium Carb-Cholecalciferol (CALCIUM CARBONATE-VITAMIN D3) 600-400 MG-UNIT TABS Take 1 tablet by mouth daily.     dapagliflozin propanediol (FARXIGA) 10 MG TABS tablet Take by mouth.     DULoxetine (CYMBALTA) 60 MG capsule Take 60 mg by mouth 2 (two) times daily.      enalapril (VASOTEC) 10 MG tablet Take 10 mg by mouth daily.      esomeprazole (NEXIUM) 40 MG capsule Take 40 mg by mouth daily.      ferrous sulfate 325 (65 FE) MG tablet Take 325 mg by mouth daily.     hydrochlorothiazide (HYDRODIURIL) 25 MG tablet Take 25 mg by mouth daily.      metFORMIN (GLUCOPHAGE) 500 MG tablet Take 500 mg by mouth 2 (two) times daily.      rosuvastatin (CRESTOR) 40 MG tablet Take 40 mg by mouth every evening.     busPIRone (BUSPAR) 5 MG tablet Take 5 mg by mouth 2 (two) times daily. (Patient not taking: Reported on 10/03/2021)     No current facility-administered medications for this visit.     PHYSICAL EXAMINATION: ECOG PERFORMANCE STATUS: 1 - Symptomatic but completely ambulatory There were no vitals filed for this visit.  Filed Weights   10/03/21 1251  Weight: 181 lb (82.1 kg)    Physical Exam Constitutional:      General: She is not in acute distress. HENT:     Head: Normocephalic and atraumatic.  Eyes:     General: No scleral icterus.     Pupils: Pupils are equal, round, and reactive to light.  Cardiovascular:     Rate and Rhythm: Normal rate and regular rhythm.     Heart sounds: Normal heart sounds.  Pulmonary:     Effort: Pulmonary effort is normal. No respiratory distress.     Breath sounds: No wheezing.  Abdominal:     General: Bowel sounds are normal. There is no distension.     Palpations: Abdomen is soft. There is no mass.     Tenderness: There is no abdominal tenderness.  Musculoskeletal:  General: No deformity. Normal range of motion.     Cervical back: Normal range of motion and neck supple.  Skin:    General: Skin is warm and dry.     Findings: No erythema or rash.  Neurological:     Mental Status: She is alert and oriented to person, place, and time.     Cranial Nerves: No cranial nerve deficit.     Coordination: Coordination normal.  Psychiatric:        Behavior: Behavior normal.        Thought Content: Thought content normal.    LABORATORY DATA:  I have reviewed the data as listed Lab Results  Component Value Date   WBC 7.2 09/30/2021   HGB 11.6 (L) 09/30/2021   HCT 35.5 (L) 09/30/2021   MCV 89.9 09/30/2021   PLT 283 09/30/2021   Recent Labs    03/24/21 1048 09/30/21 1321  NA 134* 133*  K 3.5 3.4*  CL 96* 96*  CO2 26 23  GLUCOSE 172* 148*  BUN 20 24*  CREATININE 1.20* 1.31*  CALCIUM 9.2 9.4  GFRNONAA 49* 44*  PROT 7.8 7.9  ALBUMIN 4.1 4.2  AST 33 30  ALT 31 25  ALKPHOS 63 55  BILITOT 0.7 0.5    Iron/TIBC/Ferritin/ %Sat    Component Value Date/Time   IRON 71 11/17/2019 1121   TIBC 385 11/17/2019 1121   FERRITIN 63 11/17/2019 1121   IRONPCTSAT 18 11/17/2019 1121      RADIOGRAPHIC STUDIES: I have personally reviewed the radiological images as listed and agreed with the findings in the report. No results found.   ASSESSMENT & PLAN:  1. MGUS (monoclonal gammopathy of unknown significance)   2. Normocytic anemia    #IgG lambda MGUS, Labs are reviewed and  discussed with patient. Multiple myeloma panel is pending at the time of dictation Free light chain ratio is stable and normal. Continue follow-up every 6 months with lab work. Check NT proBNP once a year.  #Normocytic anemia, hemoglobin stable at 11.6.Marland Kitchen  Likely anemia due to CKD.  Orders Placed This Encounter  Procedures   Comprehensive metabolic panel    Standing Status:   Future    Standing Expiration Date:   10/03/2022   CBC with Differential/Platelet    Standing Status:   Future    Standing Expiration Date:   10/03/2022   Multiple Myeloma Panel (SPEP&IFE w/QIG)    Standing Status:   Future    Standing Expiration Date:   10/03/2022   Kappa/lambda light chains    Standing Status:   Future    Standing Expiration Date:   10/03/2022   Miscellaneous [facility] test (send-out)    Standing Status:   Future    Standing Expiration Date:   10/03/2022    Order Specific Question:   Test name / description:    Answer:   NT-pro BNP test # [id number]    All questions were answered. The patient knows to call the clinic with any problems questions or concerns.  cc Gayland Curry, MD    Return of visit: 6 months   Earlie Server, MD, PhD 10/05/2021

## 2021-10-06 LAB — MULTIPLE MYELOMA PANEL, SERUM
Albumin SerPl Elph-Mcnc: 3.9 g/dL (ref 2.9–4.4)
Albumin/Glob SerPl: 1.2 (ref 0.7–1.7)
Alpha 1: 0.3 g/dL (ref 0.0–0.4)
Alpha2 Glob SerPl Elph-Mcnc: 0.9 g/dL (ref 0.4–1.0)
B-Globulin SerPl Elph-Mcnc: 1.1 g/dL (ref 0.7–1.3)
Gamma Glob SerPl Elph-Mcnc: 1.1 g/dL (ref 0.4–1.8)
Globulin, Total: 3.4 g/dL (ref 2.2–3.9)
IgA: 148 mg/dL (ref 87–352)
IgG (Immunoglobin G), Serum: 1200 mg/dL (ref 586–1602)
IgM (Immunoglobulin M), Srm: 57 mg/dL (ref 26–217)
M Protein SerPl Elph-Mcnc: 0.6 g/dL — ABNORMAL HIGH
Total Protein ELP: 7.3 g/dL (ref 6.0–8.5)

## 2021-10-06 IMAGING — MG DIGITAL SCREENING BILAT W/ TOMO W/ CAD
8 series · 8 of 24 positions shown · non-contrast
Comparison: Previous exam(s).

CLINICAL DATA: Screening.

EXAM:
DIGITAL SCREENING BILATERAL MAMMOGRAM WITH TOMO AND CAD

[L CC synth-2D]
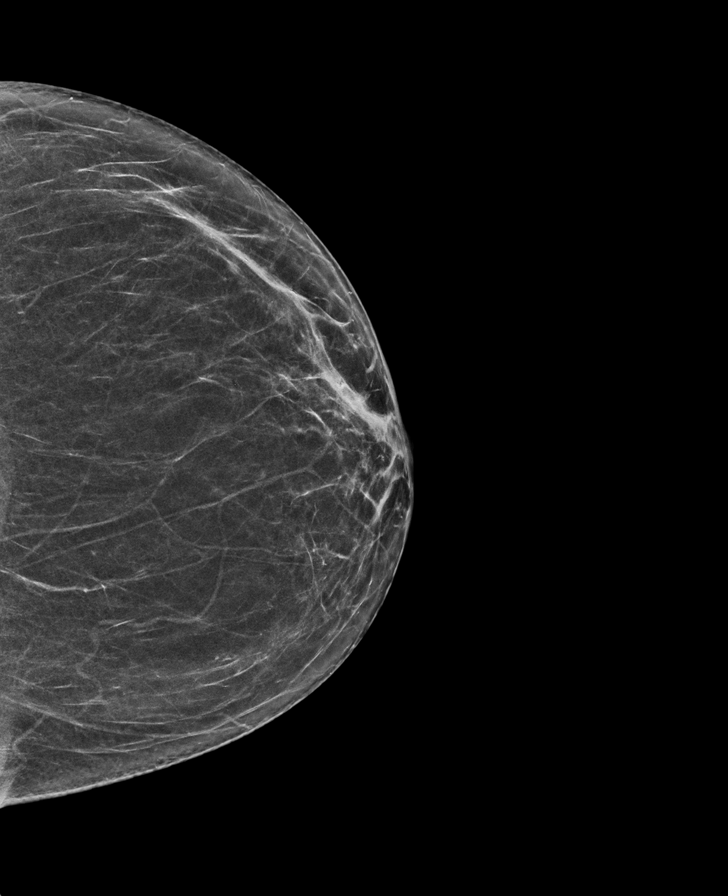

[L MLO synth-2D]
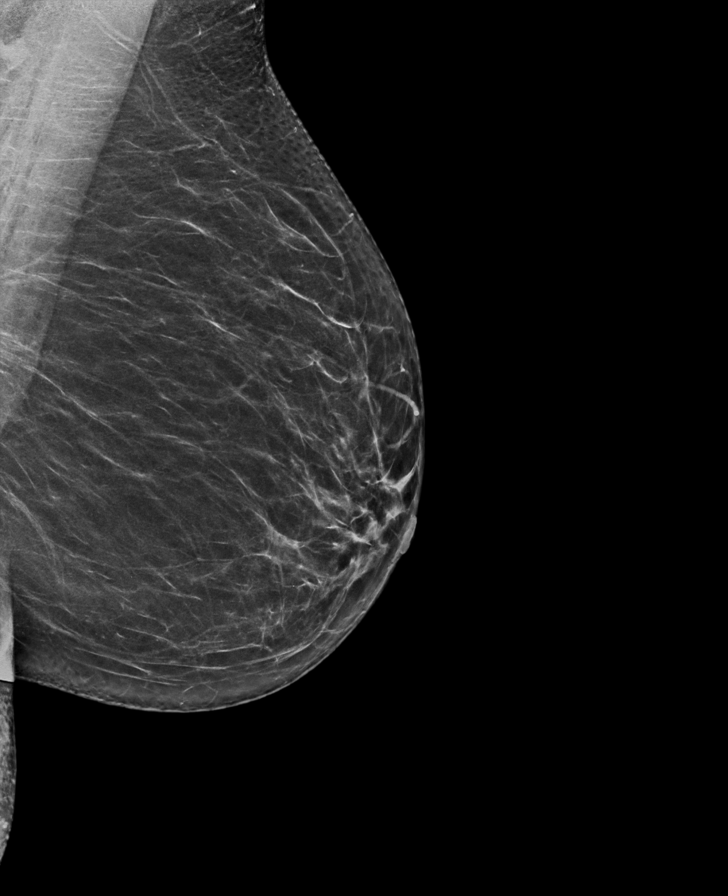

[R CC synth-2D]
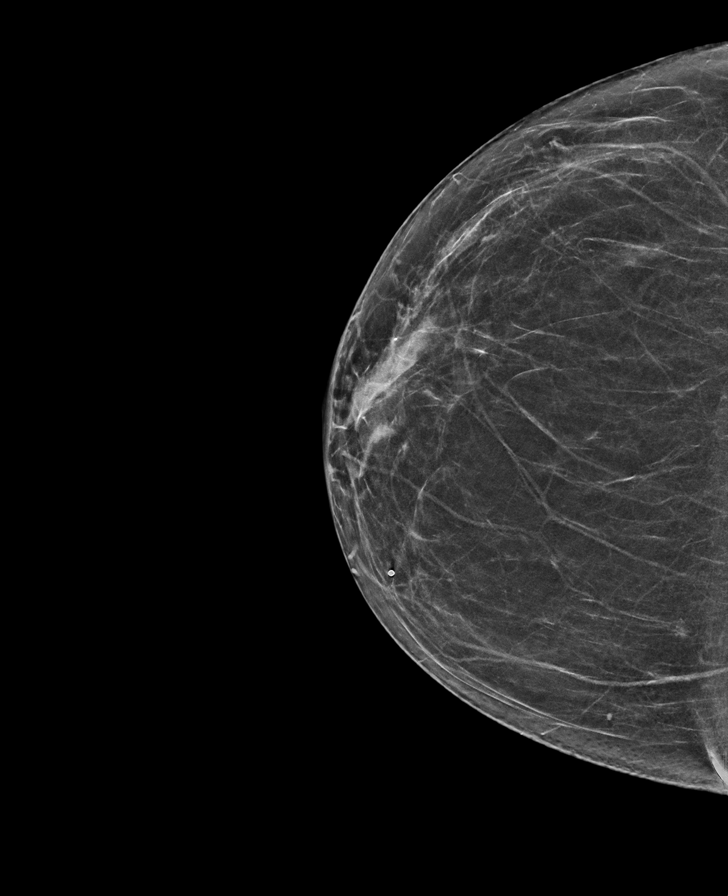

[R MLO synth-2D]
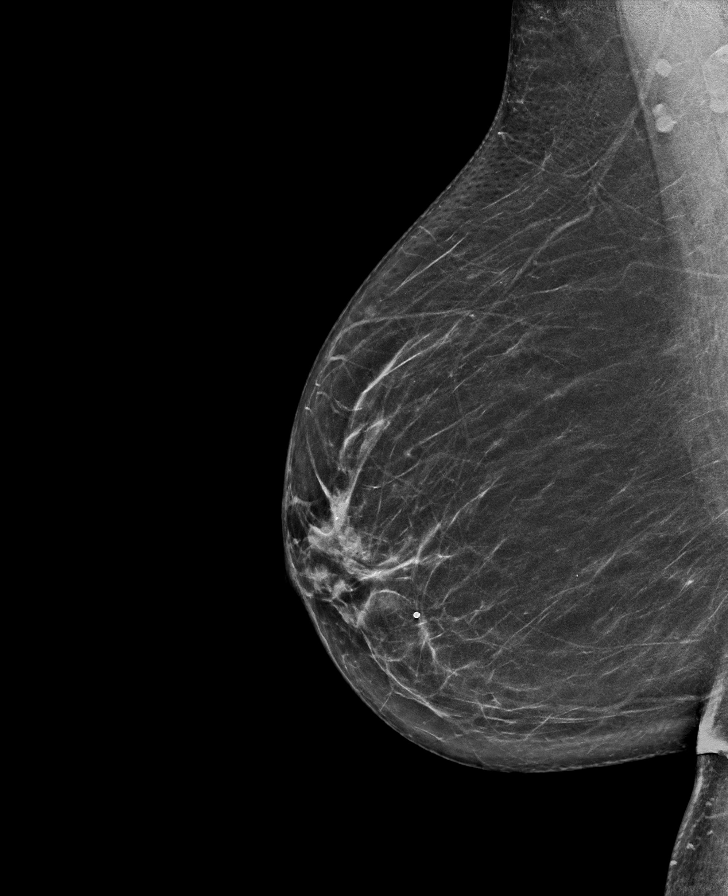

[L MLO tomo · tomo slice 35/68.0]
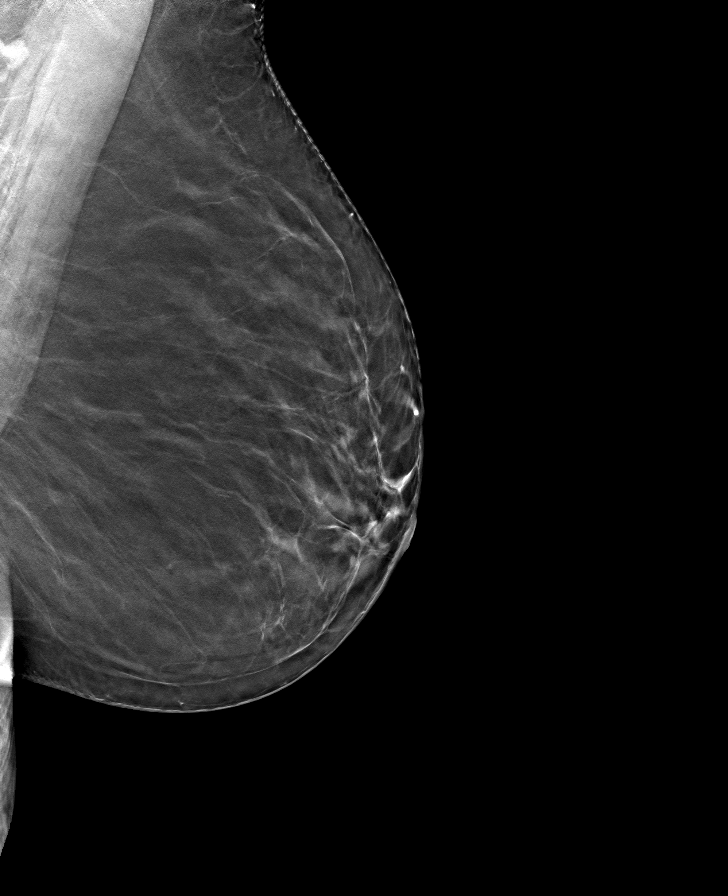

[R CC tomo · tomo slice 31/60.0]
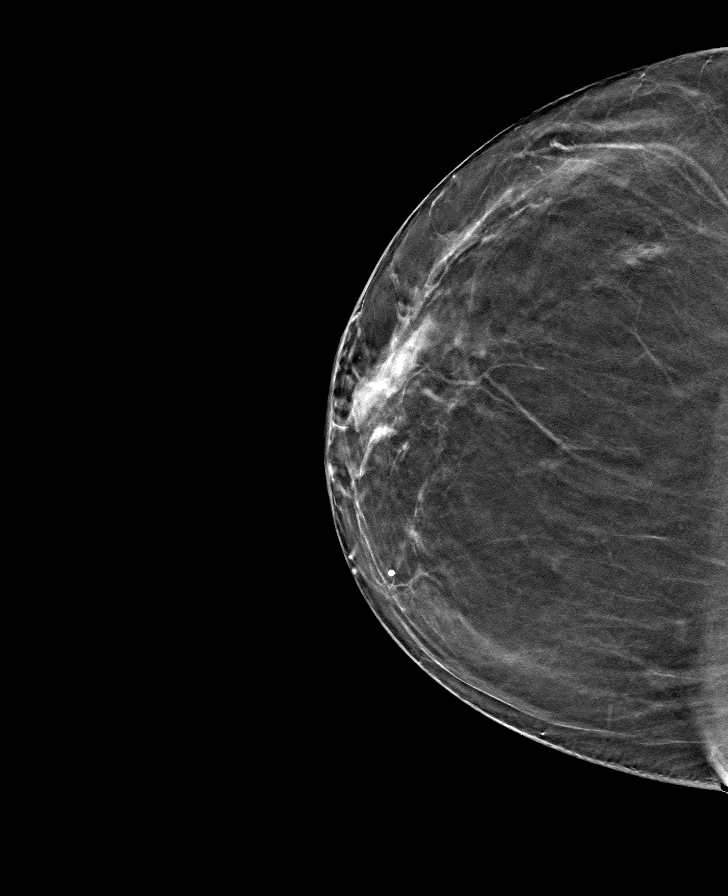

[L CC tomo · tomo slice 31/61.0]
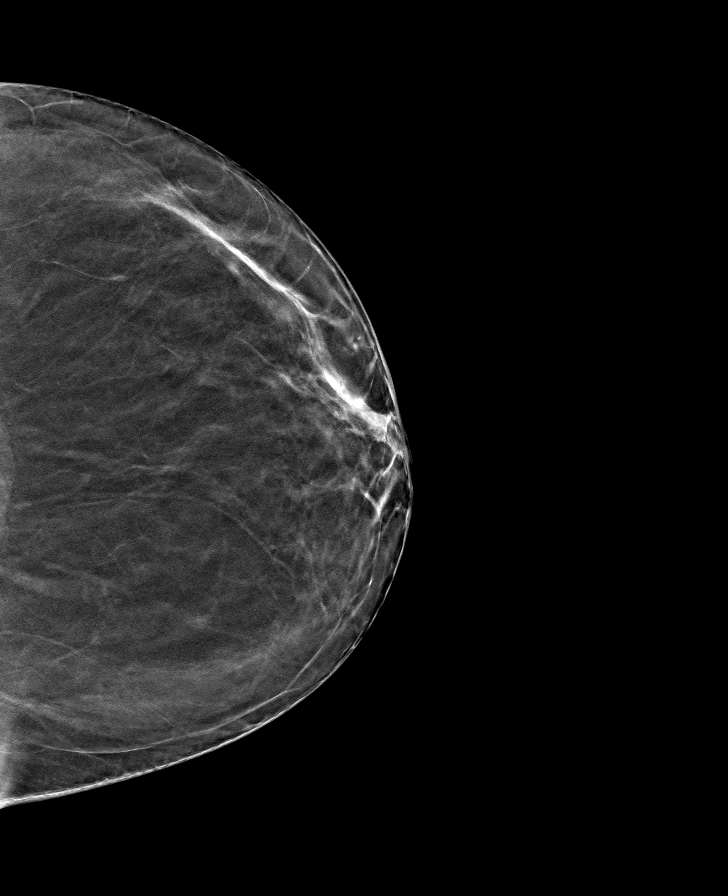

[R MLO tomo · tomo slice 35/68.0]
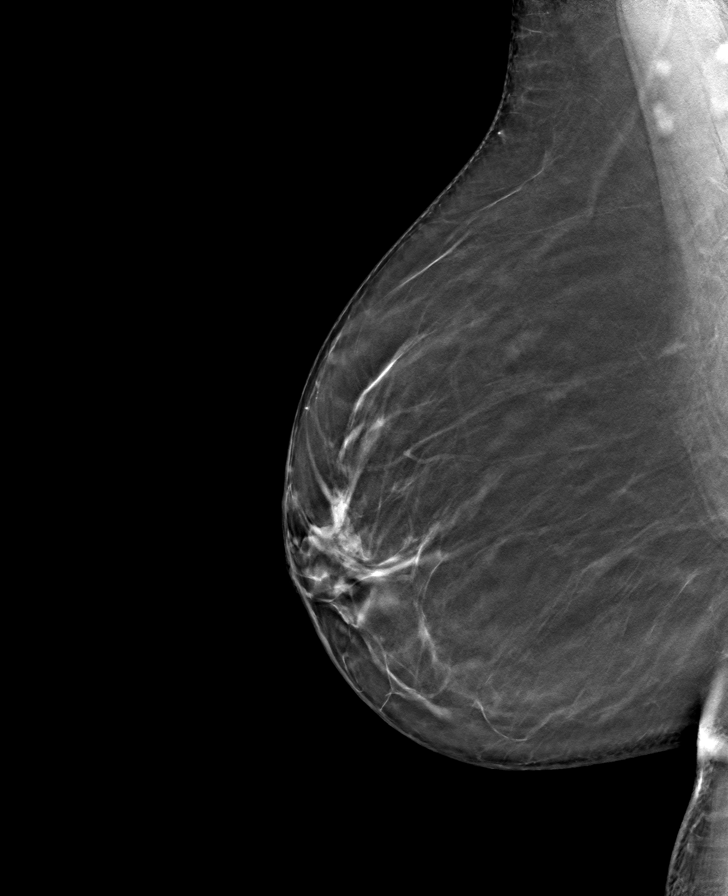

[8 of 24 positions shown; findings below may reference images not displayed]

ACR Breast Density Category b: There are scattered areas of
fibroglandular density.
FINDINGS: There are no findings suspicious for malignancy. Images were
processed with CAD.
IMPRESSION: No mammographic evidence of malignancy. A result letter of this
screening mammogram will be mailed directly to the patient.

RECOMMENDATION:
Screening mammogram in one year. (Code:CN-U-775)

BI-RADS CATEGORY  1: Negative.

## 2022-03-27 ENCOUNTER — Inpatient Hospital Stay: Payer: Medicare Other | Attending: Oncology

## 2022-03-27 ENCOUNTER — Other Ambulatory Visit: Payer: Self-pay

## 2022-03-27 DIAGNOSIS — D649 Anemia, unspecified: Secondary | ICD-10-CM | POA: Insufficient documentation

## 2022-03-27 DIAGNOSIS — I129 Hypertensive chronic kidney disease with stage 1 through stage 4 chronic kidney disease, or unspecified chronic kidney disease: Secondary | ICD-10-CM | POA: Insufficient documentation

## 2022-03-27 DIAGNOSIS — Z79899 Other long term (current) drug therapy: Secondary | ICD-10-CM | POA: Insufficient documentation

## 2022-03-27 DIAGNOSIS — R5383 Other fatigue: Secondary | ICD-10-CM | POA: Diagnosis not present

## 2022-03-27 DIAGNOSIS — N189 Chronic kidney disease, unspecified: Secondary | ICD-10-CM | POA: Insufficient documentation

## 2022-03-27 DIAGNOSIS — E1122 Type 2 diabetes mellitus with diabetic chronic kidney disease: Secondary | ICD-10-CM | POA: Insufficient documentation

## 2022-03-27 DIAGNOSIS — D472 Monoclonal gammopathy: Secondary | ICD-10-CM | POA: Diagnosis present

## 2022-03-27 DIAGNOSIS — Z7984 Long term (current) use of oral hypoglycemic drugs: Secondary | ICD-10-CM | POA: Insufficient documentation

## 2022-03-27 DIAGNOSIS — Z8541 Personal history of malignant neoplasm of cervix uteri: Secondary | ICD-10-CM | POA: Diagnosis not present

## 2022-03-27 LAB — CBC WITH DIFFERENTIAL/PLATELET
Abs Immature Granulocytes: 0.02 10*3/uL (ref 0.00–0.07)
Basophils Absolute: 0.1 10*3/uL (ref 0.0–0.1)
Basophils Relative: 1 %
Eosinophils Absolute: 0.1 10*3/uL (ref 0.0–0.5)
Eosinophils Relative: 1 %
HCT: 36.6 % (ref 36.0–46.0)
Hemoglobin: 11.9 g/dL — ABNORMAL LOW (ref 12.0–15.0)
Immature Granulocytes: 0 %
Lymphocytes Relative: 25 %
Lymphs Abs: 1.9 10*3/uL (ref 0.7–4.0)
MCH: 29.6 pg (ref 26.0–34.0)
MCHC: 32.5 g/dL (ref 30.0–36.0)
MCV: 91 fL (ref 80.0–100.0)
Monocytes Absolute: 0.6 10*3/uL (ref 0.1–1.0)
Monocytes Relative: 8 %
Neutro Abs: 4.9 10*3/uL (ref 1.7–7.7)
Neutrophils Relative %: 65 %
Platelets: 270 10*3/uL (ref 150–400)
RBC: 4.02 MIL/uL (ref 3.87–5.11)
RDW: 13.8 % (ref 11.5–15.5)
WBC: 7.5 10*3/uL (ref 4.0–10.5)
nRBC: 0 % (ref 0.0–0.2)

## 2022-03-27 LAB — COMPREHENSIVE METABOLIC PANEL
ALT: 26 U/L (ref 0–44)
AST: 29 U/L (ref 15–41)
Albumin: 4.3 g/dL (ref 3.5–5.0)
Alkaline Phosphatase: 49 U/L (ref 38–126)
Anion gap: 9 (ref 5–15)
BUN: 17 mg/dL (ref 8–23)
CO2: 28 mmol/L (ref 22–32)
Calcium: 9.4 mg/dL (ref 8.9–10.3)
Chloride: 98 mmol/L (ref 98–111)
Creatinine, Ser: 1.12 mg/dL — ABNORMAL HIGH (ref 0.44–1.00)
GFR, Estimated: 53 mL/min — ABNORMAL LOW (ref >60)
Glucose, Bld: 111 mg/dL — ABNORMAL HIGH (ref 70–99)
Potassium: 4 mmol/L (ref 3.5–5.1)
Sodium: 135 mmol/L (ref 135–145)
Total Bilirubin: 0.4 mg/dL (ref 0.3–1.2)
Total Protein: 8.1 g/dL (ref 6.5–8.1)

## 2022-03-28 LAB — MISC LABCORP TEST (SEND OUT): Labcorp test code: 143000

## 2022-03-28 LAB — KAPPA/LAMBDA LIGHT CHAINS
Kappa free light chain: 40.2 mg/L — ABNORMAL HIGH (ref 3.3–19.4)
Kappa, lambda light chain ratio: 0.64 (ref 0.26–1.65)
Lambda free light chains: 62.9 mg/L — ABNORMAL HIGH (ref 5.7–26.3)

## 2022-03-30 LAB — MULTIPLE MYELOMA PANEL, SERUM
Albumin SerPl Elph-Mcnc: 3.7 g/dL (ref 2.9–4.4)
Albumin/Glob SerPl: 1.2 (ref 0.7–1.7)
Alpha 1: 0.2 g/dL (ref 0.0–0.4)
Alpha2 Glob SerPl Elph-Mcnc: 0.8 g/dL (ref 0.4–1.0)
B-Globulin SerPl Elph-Mcnc: 1 g/dL (ref 0.7–1.3)
Gamma Glob SerPl Elph-Mcnc: 1.1 g/dL (ref 0.4–1.8)
Globulin, Total: 3.2 g/dL (ref 2.2–3.9)
IgA: 184 mg/dL (ref 87–352)
IgG (Immunoglobin G), Serum: 1286 mg/dL (ref 586–1602)
IgM (Immunoglobulin M), Srm: 49 mg/dL (ref 26–217)
M Protein SerPl Elph-Mcnc: 0.5 g/dL — ABNORMAL HIGH
Total Protein ELP: 6.9 g/dL (ref 6.0–8.5)

## 2022-04-03 ENCOUNTER — Inpatient Hospital Stay (HOSPITAL_BASED_OUTPATIENT_CLINIC_OR_DEPARTMENT_OTHER): Payer: Medicare Other | Admitting: Oncology

## 2022-04-03 ENCOUNTER — Encounter: Payer: Self-pay | Admitting: Oncology

## 2022-04-03 VITALS — BP 151/78 | HR 68 | Temp 97.6°F | Resp 18 | Wt 182.0 lb

## 2022-04-03 DIAGNOSIS — D472 Monoclonal gammopathy: Secondary | ICD-10-CM

## 2022-04-03 DIAGNOSIS — D649 Anemia, unspecified: Secondary | ICD-10-CM | POA: Diagnosis not present

## 2022-04-03 NOTE — Progress Notes (Unsigned)
Pt here for follow up. No new concerns voiced.   

## 2022-04-04 NOTE — Progress Notes (Signed)
Hematology/Oncology Progress note [phone] [phone]      Patient Care Team: Gayland Curry, MD as PCP - General (Family Medicine)  REFERRING PROVIDER: Gayland Curry, MD  CHIEF COMPLAINTS/REASON FOR VISIT:  Follow-up for IgG lambda MGUS  HISTORY OF PRESENTING ILLNESS:   Sue Hayes is a  71 y.o.  female with PMH listed below was seen in consultation at the request of  Gayland Curry, MD  for evaluation of abnormal protein electrophoresis. Patient follows up with Dr. Holley Raring for chronic kidney disease.  She has history of hypertension and diabetes. As part of the CKD work-up, serum protein electrophoresis showed abnormal monoclonal spike 0.8 g/dL.  Total protein 8.2. Patient was referred to me for additional work-up and evaluation. Patient reports feeling tired and fatigued for the past few months.  Appetite is not good.  Denies any pain, fever, chills, night sweating, back pain. She was accompanied by her daughter  INTERVAL HISTORY Sue Hayes is a 71 y.o. female who has above history reviewed by me today presents for follow up visit for management of IgG lambda MGUS Chronic fatigue is unchanged.  Patient has no new complaints Review of Systems  Constitutional:  Negative for appetite change, chills, fatigue and fever.  HENT:   Negative for hearing loss and voice change.   Eyes:  Negative for eye problems.  Respiratory:  Negative for chest tightness and cough.   Cardiovascular:  Negative for chest pain.  Gastrointestinal:  Negative for abdominal distention, abdominal pain and blood in stool.  Endocrine: Negative for hot flashes.  Genitourinary:  Negative for difficulty urinating and frequency.   Musculoskeletal:  Negative for arthralgias.  Skin:  Negative for itching and rash.  Neurological:  Negative for extremity weakness.  Hematological:  Negative for adenopathy.  Psychiatric/Behavioral:  Negative for confusion.     MEDICAL HISTORY:   Past Medical History:  Diagnosis Date   Acid reflux disease    Ankle fracture, right    Arthritis    Cancer ([place])    cervical cancer   Fracture of capitate bone of wrist    HTN (hypertension)    Monoclonal gammopathy of unknown significance    Panic disorder with agoraphobia and moderate panic attacks    Recurrent major depression-severe ([place])    Sleep apnea    CPAP/ dosen't use regularly   Type II diabetes mellitus ([place])     SURGICAL HISTORY: Past Surgical History:  Procedure Laterality Date   CHOLECYSTECTOMY     COLONOSCOPY WITH PROPOFOL N/A 02/28/2021   Procedure: COLONOSCOPY WITH PROPOFOL;  Surgeon: Lesly Rubenstein, MD;  Location: [facility] END[facility];  Service: Endoscopy;  Laterality: N/A;  DM   JOINT REPLACEMENT     KNEE ARTHROPLASTY Right 06/11/2019   Procedure: COMPUTER ASSISTED TOTAL KNEE ARTHROPLASTY;  Surgeon: Dereck Leep, MD;  Location: [facility] ORS;  Service: Orthopedics;  Laterality: Right;   KNEE ARTHROSCOPY Bilateral    TUBAL LIGATION      SOCIAL HISTORY: Social History   Socioeconomic History   Marital status: Divorced    Spouse name: Not on file   Number of children: Not on file   Years of education: Not on file   Highest education level: Not on file  Occupational History   Not on file  Tobacco Use   Smoking status: Never   Smokeless tobacco: Never  Vaping Use   Vaping Use: Never used  Substance and Sexual Activity   Alcohol use: Yes    Alcohol/week: 5.0 standard  drinks of alcohol    Types: 5 Glasses of wine per week    Comment: occ    Drug use: Never   Sexual activity: Not on file  Other Topics Concern   Not on file  Social History Narrative   Not on file   Social Determinants of Health   Financial Resource Strain: Not on file  Food Insecurity: Not on file  Transportation Needs: Not on file  Physical Activity: Not on file  Stress: Not on file  Social Connections: Not on file  Intimate Partner Violence: Not on file    FAMILY  HISTORY: Family History  Problem Relation Age of Onset   Depression Mother    Uterine cancer Mother    Diabetes Mother    Heart disease Mother    Bipolar disorder Sister    Depression Sister    Melanoma Sister    Breast cancer Cousin    Bone cancer Father    Lung cancer Father    Melanoma Brother    Diabetes Brother    Melanoma Sister    Uterine cancer Daughter     ALLERGIES:  is allergic to penicillins and zolpidem.  MEDICATIONS:  Current Outpatient Medications  Medication Sig Dispense Refill   acetaminophen (TYLENOL) 500 MG tablet Take 500 mg by mouth 2 (two) times daily as needed for moderate pain or headache.     atenolol (TENORMIN) 25 MG tablet Take 25 mg by mouth daily.      Calcium Carb-Cholecalciferol (CALCIUM CARBONATE-VITAMIN D3) 600-400 MG-UNIT TABS Take 1 tablet by mouth daily.     cyanocobalamin 1000 MCG tablet Take 1,000 mcg by mouth daily.     dapagliflozin propanediol (FARXIGA) 10 MG TABS tablet Take by mouth.     DULoxetine (CYMBALTA) 60 MG capsule Take 60 mg by mouth 2 (two) times daily.      enalapril (VASOTEC) 10 MG tablet Take 10 mg by mouth daily.      esomeprazole (NEXIUM) 40 MG capsule Take 40 mg by mouth daily.      ferrous sulfate 325 (65 FE) MG tablet Take 325 mg by mouth daily.     hydrochlorothiazide (HYDRODIURIL) 25 MG tablet Take 25 mg by mouth daily.      hydroxychloroquine (PLAQUENIL) 200 MG tablet Take 200 mg by mouth 2 (two) times daily.     metFORMIN (GLUCOPHAGE) 500 MG tablet Take 500 mg by mouth 2 (two) times daily.      rosuvastatin (CRESTOR) 40 MG tablet Take 40 mg by mouth every evening.     No current facility-administered medications for this visit.     PHYSICAL EXAMINATION: ECOG PERFORMANCE STATUS: 1 - Symptomatic but completely ambulatory Vitals:   04/03/22 1321  BP: (!) 151/78  Pulse: 68  Resp: 18  Temp: 97.6 F (36.4 C)   Filed Weights   04/03/22 1321  Weight: 182 lb (82.6 kg)    Physical Exam Constitutional:       General: She is not in acute distress. HENT:     Head: Normocephalic and atraumatic.  Eyes:     General: No scleral icterus.    Pupils: Pupils are equal, round, and reactive to light.  Cardiovascular:     Rate and Rhythm: Normal rate and regular rhythm.     Heart sounds: Normal heart sounds.  Pulmonary:     Effort: Pulmonary effort is normal. No respiratory distress.     Breath sounds: No wheezing.  Abdominal:     General: Bowel sounds are  normal. There is no distension.     Palpations: Abdomen is soft. There is no mass.     Tenderness: There is no abdominal tenderness.  Musculoskeletal:        General: No deformity. Normal range of motion.     Cervical back: Normal range of motion and neck supple.  Skin:    General: Skin is warm and dry.     Findings: No erythema or rash.  Neurological:     Mental Status: She is alert and oriented to person, place, and time.     Cranial Nerves: No cranial nerve deficit.     Coordination: Coordination normal.  Psychiatric:        Behavior: Behavior normal.        Thought Content: Thought content normal.     LABORATORY DATA:  I have reviewed the data as listed Lab Results  Component Value Date   WBC 7.5 03/27/2022   HGB 11.9 (L) 03/27/2022   HCT 36.6 03/27/2022   MCV 91.0 03/27/2022   PLT 270 03/27/2022   Recent Labs    09/30/21 1321 03/27/22 1321  NA 133* 135  K 3.4* 4.0  CL 96* 98  CO2 23 28  GLUCOSE 148* 111*  BUN 24* 17  CREATININE 1.31* 1.12*  CALCIUM 9.4 9.4  GFRNONAA 44* 53*  PROT 7.9 8.1  ALBUMIN 4.2 4.3  AST 30 29  ALT 25 26  ALKPHOS 55 49  BILITOT 0.5 0.4    Iron/TIBC/Ferritin/ %Sat    Component Value Date/Time   IRON 71 11/17/2019 1121   TIBC 385 11/17/2019 1121   FERRITIN 63 11/17/2019 1121   IRONPCTSAT 18 11/17/2019 1121      RADIOGRAPHIC STUDIES: I have personally reviewed the radiological images as listed and agreed with the findings in the report. No results found.   ASSESSMENT & PLAN:   1. MGUS (monoclonal gammopathy of unknown significance)   2. Normocytic anemia    #IgG lambda MGUS, Labs reviewed and discussed with patient.  Stable M protein at 0.5.  Light chain ratio.  NT proBNP within normal limits.  #Normocytic anemia, her hemoglobin has remained stable.  Likely anemia due to CKD.  Orders Placed This Encounter  Procedures   CBC with Differential/Platelet    Standing Status:   Future    Standing Expiration Date:   04/04/2023   Comprehensive metabolic panel    Standing Status:   Future    Standing Expiration Date:   04/04/2023   Kappa/lambda light chains    Standing Status:   Future    Standing Expiration Date:   04/04/2023   Multiple Myeloma Panel (SPEP&IFE w/QIG)    Standing Status:   Future    Standing Expiration Date:   04/04/2023   Miscellaneous [facility] test (send-out)    Standing Status:   Future    Standing Expiration Date:   04/04/2023    Order Specific Question:   Test name / description:    Answer:   NT-pro BNP test # [id number]    All questions were answered. The patient knows to call the clinic with any problems questions or concerns.  cc Gayland Curry, MD    Return of visit: 1 year  Earlie Server, MD, PhD [facility] Hematology Oncology 04/03/2022

## 2022-04-06 ENCOUNTER — Other Ambulatory Visit: Payer: Self-pay | Admitting: Family Medicine

## 2022-04-06 DIAGNOSIS — E118 Type 2 diabetes mellitus with unspecified complications: Secondary | ICD-10-CM

## 2022-04-06 DIAGNOSIS — Z780 Asymptomatic menopausal state: Secondary | ICD-10-CM

## 2022-04-19 ENCOUNTER — Other Ambulatory Visit: Payer: Self-pay | Admitting: Family Medicine

## 2022-04-19 DIAGNOSIS — Z1231 Encounter for screening mammogram for malignant neoplasm of breast: Secondary | ICD-10-CM

## 2022-04-21 ENCOUNTER — Ambulatory Visit
Admission: RE | Admit: 2022-04-21 | Discharge: 2022-04-21 | Disposition: A | Discharge status: Home / Self Care | Payer: Medicare Other | Source: Ambulatory Visit | Attending: Family Medicine | Admitting: Family Medicine

## 2022-04-21 DIAGNOSIS — Z1231 Encounter for screening mammogram for malignant neoplasm of breast: Secondary | ICD-10-CM | POA: Insufficient documentation

## 2022-04-21 DIAGNOSIS — E118 Type 2 diabetes mellitus with unspecified complications: Secondary | ICD-10-CM | POA: Insufficient documentation

## 2022-04-21 DIAGNOSIS — Z780 Asymptomatic menopausal state: Secondary | ICD-10-CM | POA: Diagnosis present

## 2023-03-30 ENCOUNTER — Inpatient Hospital Stay: Payer: Medicare Other | Attending: Oncology

## 2023-03-30 DIAGNOSIS — N189 Chronic kidney disease, unspecified: Secondary | ICD-10-CM | POA: Insufficient documentation

## 2023-03-30 DIAGNOSIS — E1122 Type 2 diabetes mellitus with diabetic chronic kidney disease: Secondary | ICD-10-CM | POA: Insufficient documentation

## 2023-03-30 DIAGNOSIS — D472 Monoclonal gammopathy: Secondary | ICD-10-CM | POA: Insufficient documentation

## 2023-03-30 DIAGNOSIS — D631 Anemia in chronic kidney disease: Secondary | ICD-10-CM | POA: Diagnosis not present

## 2023-03-30 DIAGNOSIS — I1310 Hypertensive heart and chronic kidney disease without heart failure, with stage 1 through stage 4 chronic kidney disease, or unspecified chronic kidney disease: Secondary | ICD-10-CM | POA: Insufficient documentation

## 2023-03-30 LAB — CBC WITH DIFFERENTIAL/PLATELET
Abs Immature Granulocytes: 0.02 10*3/uL (ref 0.00–0.07)
Basophils Absolute: 0.1 10*3/uL (ref 0.0–0.1)
Basophils Relative: 1 %
Eosinophils Absolute: 0.1 10*3/uL (ref 0.0–0.5)
Eosinophils Relative: 1 %
HCT: 37.6 % (ref 36.0–46.0)
Hemoglobin: 12.4 g/dL (ref 12.0–15.0)
Immature Granulocytes: 0 %
Lymphocytes Relative: 28 %
Lymphs Abs: 2 10*3/uL (ref 0.7–4.0)
MCH: 30 pg (ref 26.0–34.0)
MCHC: 33 g/dL (ref 30.0–36.0)
MCV: 91 fL (ref 80.0–100.0)
Monocytes Absolute: 0.4 10*3/uL (ref 0.1–1.0)
Monocytes Relative: 6 %
Neutro Abs: 4.6 10*3/uL (ref 1.7–7.7)
Neutrophils Relative %: 64 %
Platelets: 295 10*3/uL (ref 150–400)
RBC: 4.13 MIL/uL (ref 3.87–5.11)
RDW: 12.8 % (ref 11.5–15.5)
WBC: 7.2 10*3/uL (ref 4.0–10.5)
nRBC: 0 % (ref 0.0–0.2)

## 2023-03-30 LAB — COMPREHENSIVE METABOLIC PANEL
ALT: 21 U/L (ref 0–44)
AST: 29 U/L (ref 15–41)
Albumin: 4.6 g/dL (ref 3.5–5.0)
Alkaline Phosphatase: 56 U/L (ref 38–126)
Anion gap: 13 (ref 5–15)
BUN: 19 mg/dL (ref 8–23)
CO2: 24 mmol/L (ref 22–32)
Calcium: 9.5 mg/dL (ref 8.9–10.3)
Chloride: 100 mmol/L (ref 98–111)
Creatinine, Ser: 1.27 mg/dL — ABNORMAL HIGH (ref 0.44–1.00)
GFR, Estimated: 45 mL/min — ABNORMAL LOW (ref >60)
Glucose, Bld: 140 mg/dL — ABNORMAL HIGH (ref 70–99)
Potassium: 3.1 mmol/L — ABNORMAL LOW (ref 3.5–5.1)
Sodium: 137 mmol/L (ref 135–145)
Total Bilirubin: 0.5 mg/dL (ref 0.3–1.2)
Total Protein: 8.3 g/dL — ABNORMAL HIGH (ref 6.5–8.1)

## 2023-03-31 LAB — MISC LABCORP TEST (SEND OUT): Labcorp test code: 143000

## 2023-04-02 LAB — KAPPA/LAMBDA LIGHT CHAINS
Kappa free light chain: 27.4 mg/L — ABNORMAL HIGH (ref 3.3–19.4)
Kappa, lambda light chain ratio: 0.39 (ref 0.26–1.65)
Lambda free light chains: 69.4 mg/L — ABNORMAL HIGH (ref 5.7–26.3)

## 2023-04-04 LAB — MULTIPLE MYELOMA PANEL, SERUM
Albumin SerPl Elph-Mcnc: 4.1 g/dL (ref 2.9–4.4)
Albumin/Glob SerPl: 1.2 (ref 0.7–1.7)
Alpha 1: 0.3 g/dL (ref 0.0–0.4)
Alpha2 Glob SerPl Elph-Mcnc: 0.9 g/dL (ref 0.4–1.0)
B-Globulin SerPl Elph-Mcnc: 1.1 g/dL (ref 0.7–1.3)
Gamma Glob SerPl Elph-Mcnc: 1.2 g/dL (ref 0.4–1.8)
Globulin, Total: 3.5 g/dL (ref 2.2–3.9)
IgA: 163 mg/dL (ref 64–422)
IgG (Immunoglobin G), Serum: 1414 mg/dL (ref 586–1602)
IgM (Immunoglobulin M), Srm: 44 mg/dL (ref 26–217)
M Protein SerPl Elph-Mcnc: 0.6 g/dL — ABNORMAL HIGH
Total Protein ELP: 7.6 g/dL (ref 6.0–8.5)

## 2023-04-09 ENCOUNTER — Encounter: Payer: Self-pay | Admitting: Oncology

## 2023-04-09 ENCOUNTER — Inpatient Hospital Stay (HOSPITAL_BASED_OUTPATIENT_CLINIC_OR_DEPARTMENT_OTHER): Payer: Medicare Other | Admitting: Oncology

## 2023-04-09 VITALS — BP 136/70 | HR 81 | Temp 96.7°F | Resp 18 | Wt 178.4 lb

## 2023-04-09 DIAGNOSIS — D649 Anemia, unspecified: Secondary | ICD-10-CM | POA: Diagnosis not present

## 2023-04-09 DIAGNOSIS — D472 Monoclonal gammopathy: Secondary | ICD-10-CM

## 2023-04-09 NOTE — Progress Notes (Signed)
Hematology/Oncology Progress note [phone] [phone]      Patient Care Team: Leim Fabry, MD as PCP - General (Family Medicine)  REFERRING PROVIDER: Leim Fabry, MD  CHIEF COMPLAINTS/REASON FOR VISIT:  Follow-up for IgG lambda MGUS  HISTORY OF PRESENTING ILLNESS:   Sue Hayes is a  72 y.o.  female with PMH listed below was seen in consultation at the request of  Leim Fabry, MD  for evaluation of abnormal protein electrophoresis. Patient follows up with Dr. Cherylann Ratel for chronic kidney disease.  She has history of hypertension and diabetes. As part of the CKD work-up, serum protein electrophoresis showed abnormal monoclonal spike 0.8 g/dL.  Total protein 8.2. Patient was referred to me for additional work-up and evaluation. Patient reports feeling tired and fatigued for the past few months.  Appetite is not good.  Denies any pain, fever, chills, night sweating, back pain. She was accompanied by her daughter  INTERVAL HISTORY Sue Hayes is a 72 y.o. female who has above history reviewed by me today presents for follow up visit for management of IgG lambda MGUS Chronic fatigue is unchanged.  Patient has no new complaints Review of Systems  Constitutional:  Negative for appetite change, chills, fatigue and fever.  HENT:   Negative for hearing loss and voice change.   Eyes:  Negative for eye problems.  Respiratory:  Negative for chest tightness and cough.   Cardiovascular:  Negative for chest pain.  Gastrointestinal:  Negative for abdominal distention, abdominal pain and blood in stool.  Endocrine: Negative for hot flashes.  Genitourinary:  Negative for difficulty urinating and frequency.   Musculoskeletal:  Negative for arthralgias.  Skin:  Negative for itching and rash.  Neurological:  Negative for extremity weakness.  Hematological:  Negative for adenopathy.  Psychiatric/Behavioral:  Negative for confusion.     MEDICAL HISTORY:   Past Medical History:  Diagnosis Date   Acid reflux disease    Ankle fracture, right    Arthritis    Cancer (HCC)    cervical cancer   Fracture of capitate bone of wrist    HTN (hypertension)    Monoclonal gammopathy of unknown significance    Panic disorder with agoraphobia and moderate panic attacks    Recurrent major depression-severe (HCC)    Sleep apnea    CPAP/ dosen't use regularly   Type II diabetes mellitus (HCC)     SURGICAL HISTORY: Past Surgical History:  Procedure Laterality Date   CHOLECYSTECTOMY     COLONOSCOPY WITH PROPOFOL N/A 02/28/2021   Procedure: COLONOSCOPY WITH PROPOFOL;  Surgeon: Regis Bill, MD;  Location: [facility];  Service: Endoscopy;  Laterality: N/A;  DM   JOINT REPLACEMENT     KNEE ARTHROPLASTY Right 06/11/2019   Procedure: COMPUTER ASSISTED TOTAL KNEE ARTHROPLASTY;  Surgeon: Donato Heinz, MD;  Location: [facility] ORS;  Service: Orthopedics;  Laterality: Right;   KNEE ARTHROSCOPY Bilateral    TUBAL LIGATION      SOCIAL HISTORY: Social History   Socioeconomic History   Marital status: Divorced    Spouse name: Not on file   Number of children: Not on file   Years of education: Not on file   Highest education level: Not on file  Occupational History   Not on file  Tobacco Use   Smoking status: Never   Smokeless tobacco: Never  Vaping Use   Vaping Use: Never used  Substance and Sexual Activity   Alcohol use: Yes    Alcohol/week: 5.0 standard  drinks of alcohol    Types: 5 Glasses of wine per week    Comment: occ    Drug use: Never   Sexual activity: Not on file  Other Topics Concern   Not on file  Social History Narrative   Not on file   Social Determinants of Health   Financial Resource Strain: Not on file  Food Insecurity: Not on file  Transportation Needs: Not on file  Physical Activity: Not on file  Stress: Not on file  Social Connections: Not on file  Intimate Partner Violence: Not on file    FAMILY  HISTORY: Family History  Problem Relation Age of Onset   Depression Mother    Uterine cancer Mother    Diabetes Mother    Heart disease Mother    Bipolar disorder Sister    Depression Sister    Melanoma Sister    Breast cancer Cousin    Bone cancer Father    Lung cancer Father    Melanoma Brother    Diabetes Brother    Melanoma Sister    Uterine cancer Daughter     ALLERGIES:  is allergic to penicillins and zolpidem.  MEDICATIONS:  Current Outpatient Medications  Medication Sig Dispense Refill   acetaminophen (TYLENOL) 500 MG tablet Take 500 mg by mouth 2 (two) times daily as needed for moderate pain or headache.     atenolol (TENORMIN) 25 MG tablet Take 25 mg by mouth daily.      Calcium Carb-Cholecalciferol (CALCIUM CARBONATE-VITAMIN D3) 600-400 MG-UNIT TABS Take 1 tablet by mouth daily.     cyanocobalamin 1000 MCG tablet Take 1,000 mcg by mouth daily.     DULoxetine (CYMBALTA) 60 MG capsule Take 60 mg by mouth 2 (two) times daily.      enalapril (VASOTEC) 10 MG tablet Take 10 mg by mouth daily.      esomeprazole (NEXIUM) 40 MG capsule Take 40 mg by mouth daily.      ferrous sulfate 325 (65 FE) MG tablet Take 325 mg by mouth daily.     hydrochlorothiazide (HYDRODIURIL) 25 MG tablet Take 25 mg by mouth daily.      hydroxychloroquine (PLAQUENIL) 200 MG tablet Take 200 mg by mouth 2 (two) times daily.     OZEMPIC, 0.25 OR 0.5 MG/DOSE, 2 MG/3ML SOPN Inject into the skin.     polyethylene glycol powder (GLYCOLAX/MIRALAX) 17 GM/SCOOP powder Take 1 Container by mouth once.     rosuvastatin (CRESTOR) 40 MG tablet Take 40 mg by mouth every evening.     metFORMIN (GLUCOPHAGE) 500 MG tablet Take 500 mg by mouth 2 (two) times daily.  (Patient not taking: Reported on 04/09/2023)     No current facility-administered medications for this visit.     PHYSICAL EXAMINATION: ECOG PERFORMANCE STATUS: 1 - Symptomatic but completely ambulatory Vitals:   04/09/23 1309  BP: 136/70  Pulse: 81   Resp: 18  Temp: (!) 96.7 F (35.9 C)  SpO2: 99%   Filed Weights   04/09/23 1309  Weight: 178 lb 6.4 oz (80.9 kg)    Physical Exam Constitutional:      General: She is not in acute distress. HENT:     Head: Normocephalic and atraumatic.  Eyes:     General: No scleral icterus.    Pupils: Pupils are equal, round, and reactive to light.  Cardiovascular:     Rate and Rhythm: Normal rate and regular rhythm.     Heart sounds: Normal heart sounds.  Pulmonary:  Effort: Pulmonary effort is normal. No respiratory distress.     Breath sounds: No wheezing.  Abdominal:     General: Bowel sounds are normal. There is no distension.     Palpations: Abdomen is soft. There is no mass.     Tenderness: There is no abdominal tenderness.  Musculoskeletal:        General: No deformity. Normal range of motion.     Cervical back: Normal range of motion and neck supple.  Skin:    General: Skin is warm and dry.     Findings: No erythema or rash.  Neurological:     Mental Status: She is alert and oriented to person, place, and time.     Cranial Nerves: No cranial nerve deficit.     Coordination: Coordination normal.  Psychiatric:        Behavior: Behavior normal.        Thought Content: Thought content normal.     LABORATORY DATA:  I have reviewed the data as listed Lab Results  Component Value Date   WBC 7.2 03/30/2023   HGB 12.4 03/30/2023   HCT 37.6 03/30/2023   MCV 91.0 03/30/2023   PLT 295 03/30/2023   Recent Labs    03/30/23 1317  NA 137  K 3.1*  CL 100  CO2 24  GLUCOSE 140*  BUN 19  CREATININE 1.27*  CALCIUM 9.5  GFRNONAA 45*  PROT 8.3*  ALBUMIN 4.6  AST 29  ALT 21  ALKPHOS 56  BILITOT 0.5    Iron/TIBC/Ferritin/ %Sat    Component Value Date/Time   IRON 71 11/17/2019 1121   TIBC 385 11/17/2019 1121   FERRITIN 63 11/17/2019 1121   IRONPCTSAT 18 11/17/2019 1121      RADIOGRAPHIC STUDIES: I have personally reviewed the radiological images as listed and  agreed with the findings in the report. No results found.   ASSESSMENT & PLAN:  1. Normocytic anemia   2. MGUS (monoclonal gammopathy of unknown significance)    #IgG lambda MGUS, Labs reviewed and discussed with patient.  Stable M protein at 0.5.  Light chain ratio.  NT proBNP within normal limits.  #Normocytic anemia, her hemoglobin has remained stable.  Likely anemia due to CKD.  Orders Placed This Encounter  Procedures   CBC with Differential (Cancer Center Only)    Standing Status:   Future    Standing Expiration Date:   04/08/2024   CMP (Cancer Center only)    Standing Status:   Future    Standing Expiration Date:   04/08/2024   Kappa/lambda light chains    Standing Status:   Future    Standing Expiration Date:   04/08/2024   Multiple Myeloma Panel (SPEP&IFE w/QIG)    Standing Status:   Future    Standing Expiration Date:   04/08/2024   Miscellaneous [facility] test (send-out)    Standing Status:   Future    Standing Expiration Date:   04/08/2024    Order Specific Question:   Test name / description:    Answer:   NT-ProBNP Test #[id number]    All questions were answered. The patient knows to call the clinic with any problems questions or concerns.  cc Leim Fabry, MD    Return of visit: 1 year  Rickard Patience, MD, PhD [facility] Hematology Oncology 04/09/2023

## 2023-04-10 DIAGNOSIS — D649 Anemia, unspecified: Secondary | ICD-10-CM | POA: Insufficient documentation

## 2023-04-10 DIAGNOSIS — D472 Monoclonal gammopathy: Secondary | ICD-10-CM | POA: Insufficient documentation

## 2023-04-10 NOTE — Assessment & Plan Note (Signed)
her hemoglobin has remained stable.  Likely anemia due to CKD.

## 2023-04-10 NOTE — Assessment & Plan Note (Signed)
#  IgG lambda MGUS, Labs reviewed and discussed with patient.   Lab Results  Component Value Date   MPROTEIN 0.6 (H) 03/30/2023   KPAFRELGTCHN 27.4 (H) 03/30/2023   LAMBDASER 69.4 (H) 03/30/2023   KAPLAMBRATIO 0.39 03/30/2023  NT proBNP within normal limits. Stable M protein and light chain ratio. Continue observation and repeat labs annually.

## 2024-04-04 ENCOUNTER — Inpatient Hospital Stay: Payer: Medicare Other | Attending: Oncology

## 2024-04-04 DIAGNOSIS — R5383 Other fatigue: Secondary | ICD-10-CM | POA: Diagnosis not present

## 2024-04-04 DIAGNOSIS — N189 Chronic kidney disease, unspecified: Secondary | ICD-10-CM | POA: Insufficient documentation

## 2024-04-04 DIAGNOSIS — Z79899 Other long term (current) drug therapy: Secondary | ICD-10-CM | POA: Diagnosis not present

## 2024-04-04 DIAGNOSIS — Z8541 Personal history of malignant neoplasm of cervix uteri: Secondary | ICD-10-CM | POA: Insufficient documentation

## 2024-04-04 DIAGNOSIS — E1122 Type 2 diabetes mellitus with diabetic chronic kidney disease: Secondary | ICD-10-CM | POA: Diagnosis not present

## 2024-04-04 DIAGNOSIS — I129 Hypertensive chronic kidney disease with stage 1 through stage 4 chronic kidney disease, or unspecified chronic kidney disease: Secondary | ICD-10-CM | POA: Diagnosis not present

## 2024-04-04 DIAGNOSIS — D472 Monoclonal gammopathy: Secondary | ICD-10-CM | POA: Diagnosis present

## 2024-04-04 DIAGNOSIS — E876 Hypokalemia: Secondary | ICD-10-CM | POA: Insufficient documentation

## 2024-04-04 LAB — CBC WITH DIFFERENTIAL (CANCER CENTER ONLY)
Abs Immature Granulocytes: 0.02 10*3/uL (ref 0.00–0.07)
Basophils Absolute: 0.1 10*3/uL (ref 0.0–0.1)
Basophils Relative: 1 %
Eosinophils Absolute: 0.1 10*3/uL (ref 0.0–0.5)
Eosinophils Relative: 1 %
HCT: 36.9 % (ref 36.0–46.0)
Hemoglobin: 12.2 g/dL (ref 12.0–15.0)
Immature Granulocytes: 0 %
Lymphocytes Relative: 25 %
Lymphs Abs: 1.8 10*3/uL (ref 0.7–4.0)
MCH: 30.4 pg (ref 26.0–34.0)
MCHC: 33.1 g/dL (ref 30.0–36.0)
MCV: 92 fL (ref 80.0–100.0)
Monocytes Absolute: 0.6 10*3/uL (ref 0.1–1.0)
Monocytes Relative: 9 %
Neutro Abs: 4.6 10*3/uL (ref 1.7–7.7)
Neutrophils Relative %: 64 %
Platelet Count: 248 10*3/uL (ref 150–400)
RBC: 4.01 MIL/uL (ref 3.87–5.11)
RDW: 13 % (ref 11.5–15.5)
WBC Count: 7.1 10*3/uL (ref 4.0–10.5)
nRBC: 0 % (ref 0.0–0.2)

## 2024-04-04 LAB — CMP (CANCER CENTER ONLY)
ALT: 23 U/L (ref 0–44)
AST: 27 U/L (ref 15–41)
Albumin: 4.1 g/dL (ref 3.5–5.0)
Alkaline Phosphatase: 57 U/L (ref 38–126)
Anion gap: 10 (ref 5–15)
BUN: 17 mg/dL (ref 8–23)
CO2: 25 mmol/L (ref 22–32)
Calcium: 8.9 mg/dL (ref 8.9–10.3)
Chloride: 99 mmol/L (ref 98–111)
Creatinine: 1.06 mg/dL — ABNORMAL HIGH (ref 0.44–1.00)
GFR, Estimated: 56 mL/min — ABNORMAL LOW (ref >60)
Glucose, Bld: 117 mg/dL — ABNORMAL HIGH (ref 70–99)
Potassium: 3.2 mmol/L — ABNORMAL LOW (ref 3.5–5.1)
Sodium: 134 mmol/L — ABNORMAL LOW (ref 135–145)
Total Bilirubin: 0.7 mg/dL (ref 0.0–1.2)
Total Protein: 7.7 g/dL (ref 6.5–8.1)

## 2024-04-05 LAB — MISC LABCORP TEST (SEND OUT): Labcorp test code: 143000

## 2024-04-07 ENCOUNTER — Ambulatory Visit: Payer: Medicare Other | Admitting: Oncology

## 2024-04-07 LAB — MULTIPLE MYELOMA PANEL, SERUM
Albumin SerPl Elph-Mcnc: 3.6 g/dL (ref 2.9–4.4)
Albumin/Glob SerPl: 1.2 (ref 0.7–1.7)
Alpha 1: 0.2 g/dL (ref 0.0–0.4)
Alpha2 Glob SerPl Elph-Mcnc: 0.8 g/dL (ref 0.4–1.0)
B-Globulin SerPl Elph-Mcnc: 1 g/dL (ref 0.7–1.3)
Gamma Glob SerPl Elph-Mcnc: 1.1 g/dL (ref 0.4–1.8)
Globulin, Total: 3.2 g/dL (ref 2.2–3.9)
IgA: 140 mg/dL (ref 64–422)
IgG (Immunoglobin G), Serum: 1342 mg/dL (ref 586–1602)
IgM (Immunoglobulin M), Srm: 48 mg/dL (ref 26–217)
M Protein SerPl Elph-Mcnc: 0.7 g/dL — ABNORMAL HIGH
Total Protein ELP: 6.8 g/dL (ref 6.0–8.5)

## 2024-04-07 LAB — KAPPA/LAMBDA LIGHT CHAINS
Kappa free light chain: 27.6 mg/L — ABNORMAL HIGH (ref 3.3–19.4)
Kappa, lambda light chain ratio: 0.38 (ref 0.26–1.65)
Lambda free light chains: 72.5 mg/L — ABNORMAL HIGH (ref 5.7–26.3)

## 2024-04-14 ENCOUNTER — Encounter: Payer: Self-pay | Admitting: Oncology

## 2024-04-14 ENCOUNTER — Inpatient Hospital Stay (HOSPITAL_BASED_OUTPATIENT_CLINIC_OR_DEPARTMENT_OTHER): Payer: Medicare Other | Admitting: Oncology

## 2024-04-14 VITALS — BP 139/71 | HR 73 | Temp 96.3°F | Resp 18 | Wt 184.5 lb

## 2024-04-14 DIAGNOSIS — E876 Hypokalemia: Secondary | ICD-10-CM | POA: Diagnosis not present

## 2024-04-14 DIAGNOSIS — D472 Monoclonal gammopathy: Secondary | ICD-10-CM | POA: Diagnosis not present

## 2024-04-14 NOTE — Assessment & Plan Note (Signed)
 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 Follow-up with nephrology.

## 2024-04-14 NOTE — Progress Notes (Signed)
 Hematology/Oncology Progress note [phone] [phone]      Patient Care Team: Jyl Railing, MD as PCP - General (Family Medicine) Babara Call, MD as Consulting Physician (Oncology)  CHIEF COMPLAINTS/REASON FOR VISIT:  Follow-up for IgG lambda MGUS  ASSESSMENT & PLAN:   MGUS (monoclonal gammopathy of unknown significance) #IgG lambda MGUS, Labs reviewed and discussed with patient.   Lab Results  Component Value Date   MPROTEIN 0.7 (H) 04/04/2024   KPAFRELGTCHN 27.6 (H) 04/04/2024   LAMBDASER 72.5 (H) 04/04/2024   KAPLAMBRATIO 0.38 04/04/2024  NT proBNP within normal limits. Stable M protein and light chain ratio. Continue observation and repeat labs annually.    Hypokalemia Chronic hypokalemia patient has CKD. Follow-up with nephrology.  Orders Placed This Encounter  Procedures   CBC with Differential (Cancer Center Only)    Standing Status:   Future    Expected Date:   04/14/2025    Expiration Date:   04/14/2025   CMP (Cancer Center only)    Standing Status:   Future    Expected Date:   04/14/2025    Expiration Date:   04/14/2025   Kappa/lambda light chains    Standing Status:   Future    Expected Date:   04/14/2025    Expiration Date:   04/14/2025   Multiple Myeloma Panel (SPEP&IFE w/QIG)    Standing Status:   Future    Expected Date:   04/14/2025    Expiration Date:   04/14/2025   Miscellaneous [facility] test (send-out)    Standing Status:   Future    Expected Date:   04/14/2025    Expiration Date:   04/14/2025    Test name / description::   NT-pro BNP test # [id number]   RTC 1 year All questions were answered. The patient knows to call the clinic with any problems, questions or concerns.  Call Babara, MD, PhD [facility] Hematology Oncology 04/14/2024    HISTORY OF PRESENTING ILLNESS:   Sue Hayes is a  73 y.o.  female with PMH listed below was seen in consultation at the request of  Jyl Railing, MD  for evaluation of abnormal  protein electrophoresis. Patient follows up with Dr. Marcelino for chronic kidney disease.  She has history of hypertension and diabetes. As part of the CKD work-up, serum protein electrophoresis showed abnormal monoclonal spike 0.8 g/dL.  Total protein 8.2. Patient was referred to me for additional work-up and evaluation. Patient reports feeling tired and fatigued for the past few months.  Appetite is not good.  Denies any pain, fever, chills, night sweating, back pain.  INTERVAL HISTORY Sue Hayes is a 73 y.o. female who has above history reviewed by me today presents for follow up visit for management of IgG lambda MGUS Chronic fatigue is unchanged.  Patient has no new complaints  Review of Systems  Constitutional:  Negative for appetite change, chills, fatigue and fever.  HENT:   Negative for hearing loss and voice change.   Eyes:  Negative for eye problems.  Respiratory:  Negative for chest tightness and cough.   Cardiovascular:  Negative for chest pain.  Gastrointestinal:  Negative for abdominal distention, abdominal pain and blood in stool.  Endocrine: Negative for hot flashes.  Genitourinary:  Negative for difficulty urinating and frequency.   Musculoskeletal:  Negative for arthralgias.  Skin:  Negative for itching and rash.  Neurological:  Negative for extremity weakness.  Hematological:  Negative for adenopathy.  Psychiatric/Behavioral:  Negative for confusion.  MEDICAL HISTORY:  Past Medical History:  Diagnosis Date   Acid reflux disease    Ankle fracture, right    Arthritis    Cancer (HCC)    cervical cancer   Fracture of capitate bone of wrist    HTN (hypertension)    Monoclonal gammopathy of unknown significance    Panic disorder with agoraphobia and moderate panic attacks    Recurrent major depression-severe (HCC)    Sleep apnea    CPAP/ dosen't use regularly   Type II diabetes mellitus (HCC)     SURGICAL HISTORY: Past Surgical History:  Procedure  Laterality Date   CHOLECYSTECTOMY     COLONOSCOPY WITH PROPOFOL  N/A 02/28/2021   Procedure: COLONOSCOPY WITH PROPOFOL ;  Surgeon: Maryruth Ole DASEN, MD;  Location: [facility];  Service: Endoscopy;  Laterality: N/A;  DM   JOINT REPLACEMENT     KNEE ARTHROPLASTY Right 06/11/2019   Procedure: COMPUTER ASSISTED TOTAL KNEE ARTHROPLASTY;  Surgeon: Mardee Lynwood SQUIBB, MD;  Location: [facility] OR[facility];  Service: Orthopedics;  Laterality: Right;   KNEE ARTHROSCOPY Bilateral    TUBAL LIGATION      SOCIAL HISTORY: Social History   Socioeconomic History   Marital status: Divorced    Spouse name: Not on file   Number of children: Not on file   Years of education: Not on file   Highest education level: Not on file  Occupational History   Not on file  Tobacco Use   Smoking status: Never   Smokeless tobacco: Never  Vaping Use   Vaping status: Never Used  Substance and Sexual Activity   Alcohol  use: Yes    Alcohol /week: 5.0 standard drinks of alcohol     Types: 5 Glasses of wine per week    Comment: occ    Drug use: Never   Sexual activity: Not on file  Other Topics Concern   Not on file  Social History Narrative   Not on file   Social Drivers of Health   Financial Resource Strain: Medium Risk (02/18/2024)   Received from [facility]   Overall Financial Resource Strain (CARDIA)    Difficulty of Paying Living Expenses: Somewhat hard  Food Insecurity: No Food Insecurity (02/18/2024)   Received from [facility]   Hunger Vital Sign    Within the past 12 months, you worried that your food would run out before you got the money to buy more.: Never true    Within the past 12 months, the food you bought just didn't last and you didn't have money to get more.: Never true  Transportation Needs: No Transportation Needs (02/18/2024)   Received from [facility] - [facility]    In the past 12 months, has lack of transportation kept you from  medical appointments or from getting medications?: No    Lack of Transportation (Non-Medical): No  Physical Activity: Not on file  Stress: Not on file  Social Connections: Not on file  Intimate Partner Violence: Not on file    FAMILY HISTORY: Family History  Problem Relation Age of Onset   Depression Mother    Uterine cancer Mother    Diabetes Mother    Heart disease Mother    Bipolar disorder Sister    Depression Sister    Melanoma Sister    Breast cancer Cousin    Bone cancer Father    Lung cancer Father    Melanoma Brother    Diabetes Brother    Melanoma  Sister    Uterine cancer Daughter     ALLERGIES:  is allergic to penicillins and zolpidem.  MEDICATIONS:  Current Outpatient Medications  Medication Sig Dispense Refill   acetaminophen  (TYLENOL ) 500 MG tablet Take 500 mg by mouth 2 (two) times daily as needed for moderate pain or headache.     atenolol  (TENORMIN ) 25 MG tablet Take 25 mg by mouth daily.      Calcium  Carb-Cholecalciferol (CALCIUM  CARBONATE-VITAMIN D3) 600-400 MG-UNIT TABS Take 1 tablet by mouth daily.     cyanocobalamin 1000 MCG tablet Take 1,000 mcg by mouth daily.     DULoxetine  (CYMBALTA ) 60 MG capsule Take 60 mg by mouth 2 (two) times daily.      enalapril  (VASOTEC ) 10 MG tablet Take 10 mg by mouth daily.      esomeprazole (NEXIUM) 40 MG capsule Take 40 mg by mouth daily.      ferrous sulfate 325 (65 FE) MG tablet Take 325 mg by mouth daily.     hydrochlorothiazide  (HYDRODIURIL ) 25 MG tablet Take 25 mg by mouth daily.      hydroxychloroquine (PLAQUENIL) 200 MG tablet Take 200 mg by mouth 2 (two) times daily.     OZEMPIC, 0.25 OR 0.5 MG/DOSE, 2 MG/3ML SOPN Inject into the skin.     polyethylene glycol powder (GLYCOLAX/MIRALAX) 17 GM/SCOOP powder Take 1 Container by mouth once.     rosuvastatin  (CRESTOR ) 40 MG tablet Take 40 mg by mouth every evening.     metFORMIN  (GLUCOPHAGE ) 500 MG tablet Take 500 mg by mouth 2 (two) times daily.  (Patient not  taking: Reported on 04/14/2024)     No current facility-administered medications for this visit.     PHYSICAL EXAMINATION: ECOG PERFORMANCE STATUS: 1 - Symptomatic but completely ambulatory Vitals:   04/14/24 1310  BP: 139/71  Pulse: 73  Resp: 18  Temp: (!) 96.3 F (35.7 C)  SpO2: 98%   Filed Weights   04/14/24 1310  Weight: 184 lb 8 oz (83.7 kg)    Physical Exam Constitutional:      General: She is not in acute distress. HENT:     Head: Normocephalic and atraumatic.   Eyes:     General: No scleral icterus.    Pupils: Pupils are equal, round, and reactive to light.    Cardiovascular:     Rate and Rhythm: Normal rate and regular rhythm.     Heart sounds: Normal heart sounds.  Pulmonary:     Effort: Pulmonary effort is normal. No respiratory distress.     Breath sounds: No wheezing.  Abdominal:     General: Bowel sounds are normal. There is no distension.     Palpations: Abdomen is soft. There is no mass.     Tenderness: There is no abdominal tenderness.   Musculoskeletal:        General: No deformity. Normal range of motion.     Cervical back: Normal range of motion and neck supple.   Skin:    General: Skin is warm and dry.     Findings: No erythema or rash.   Neurological:     Mental Status: She is alert and oriented to person, place, and time.     Cranial Nerves: No cranial nerve deficit.     Coordination: Coordination normal.   Psychiatric:        Behavior: Behavior normal.        Thought Content: Thought content normal.     LABORATORY DATA:  I have reviewed  the data as listed Lab Results  Component Value Date   WBC 7.1 04/04/2024   HGB 12.2 04/04/2024   HCT 36.9 04/04/2024   MCV 92.0 04/04/2024   PLT 248 04/04/2024   Recent Labs    04/04/24 1331  NA 134*  K 3.2*  CL 99  CO2 25  GLUCOSE 117*  BUN 17  CREATININE 1.06*  CALCIUM  8.9  GFRNONAA 56*  PROT 7.7  ALBUMIN 4.1  AST 27  ALT 23  ALKPHOS 57  BILITOT 0.7     RADIOGRAPHIC  STUDIES: I have personally reviewed the radiological images as listed and agreed with the findings in the report. No results found.

## 2024-04-14 NOTE — Assessment & Plan Note (Signed)
#  IgG lambda MGUS, Labs reviewed and discussed with patient.   Lab Results  Component Value Date   MPROTEIN 0.7 (H) 04/04/2024   KPAFRELGTCHN 27.6 (H) 04/04/2024   LAMBDASER 72.5 (H) 04/04/2024   KAPLAMBRATIO 0.38 04/04/2024  NT proBNP within normal limits. Stable M protein and light chain ratio. Continue observation and repeat labs annually.

## 2024-10-03 ENCOUNTER — Other Ambulatory Visit: Payer: Self-pay | Admitting: Family Medicine

## 2024-10-03 DIAGNOSIS — Z1231 Encounter for screening mammogram for malignant neoplasm of breast: Secondary | ICD-10-CM

## 2024-11-05 ENCOUNTER — Telehealth: Payer: Self-pay | Admitting: Family Medicine

## 2024-11-05 ENCOUNTER — Ambulatory Visit: Admitting: Nurse Practitioner

## 2024-11-05 NOTE — Telephone Encounter (Signed)
 Lm and sent MyChart message:  Sue Hayes has been called out of the office. Please call and reschedule your appointment.  E2C2 please reschedule

## 2024-11-05 NOTE — Telephone Encounter (Signed)
 Open in error

## 2025-01-29 ENCOUNTER — Ambulatory Visit: Admitting: Nurse Practitioner

## 2025-04-13 ENCOUNTER — Other Ambulatory Visit

## 2025-04-22 ENCOUNTER — Ambulatory Visit: Admitting: Oncology
